# Patient Record
Sex: Female | Born: 1975
Health system: Southern US, Community
[De-identification: ages and names within clinical notes are randomized; demographics above are authoritative.]

## PROBLEM LIST (undated history)

## (undated) DIAGNOSIS — IMO0002 Reserved for concepts with insufficient information to code with codable children: Secondary | ICD-10-CM

## (undated) DIAGNOSIS — Z87440 Personal history of urinary (tract) infections: Secondary | ICD-10-CM

## (undated) DIAGNOSIS — Z87448 Personal history of other diseases of urinary system: Secondary | ICD-10-CM

## (undated) DIAGNOSIS — F419 Anxiety disorder, unspecified: Secondary | ICD-10-CM

## (undated) DIAGNOSIS — N870 Mild cervical dysplasia: Secondary | ICD-10-CM

## (undated) DIAGNOSIS — R42 Dizziness and giddiness: Secondary | ICD-10-CM

## (undated) DIAGNOSIS — Z8619 Personal history of other infectious and parasitic diseases: Secondary | ICD-10-CM

## (undated) DIAGNOSIS — M50821 Other cervical disc disorders at C4-C5 level: Secondary | ICD-10-CM

## (undated) DIAGNOSIS — R102 Pelvic and perineal pain: Secondary | ICD-10-CM

## (undated) DIAGNOSIS — Z22330 Carrier of Group B streptococcus: Secondary | ICD-10-CM

## (undated) DIAGNOSIS — H7200 Central perforation of tympanic membrane, unspecified ear: Secondary | ICD-10-CM

## (undated) HISTORY — DX: Personal history of urinary (tract) infections: Z87.440

## (undated) HISTORY — DX: Personal history of other infectious and parasitic diseases: Z86.19

## (undated) HISTORY — PX: CARPAL TUNNEL RELEASE: SHX101

## (undated) HISTORY — DX: Anxiety disorder, unspecified: F41.9

## (undated) HISTORY — DX: Carrier of group B Streptococcus: Z22.330

## (undated) HISTORY — DX: Pelvic and perineal pain: R10.2

## (undated) HISTORY — DX: Reserved for concepts with insufficient information to code with codable children: IMO0002

## (undated) HISTORY — PX: INTRAUTERINE DEVICE INSERTION: SHX323

## (undated) HISTORY — DX: Personal history of other diseases of urinary system: Z87.448

## (undated) HISTORY — DX: Mild cervical dysplasia: N87.0

## (undated) HISTORY — PX: INNER EAR SURGERY: SHX679

---

## 1996-09-16 HISTORY — PX: WISDOM TOOTH EXTRACTION: SHX21

## 1998-07-17 ENCOUNTER — Inpatient Hospital Stay (HOSPITAL_COMMUNITY): Admission: AD | Admit: 1998-07-17 | Discharge: 1998-07-17 | Payer: Self-pay | Admitting: Obstetrics and Gynecology

## 1998-08-29 ENCOUNTER — Other Ambulatory Visit: Admission: RE | Admit: 1998-08-29 | Discharge: 1998-08-29 | Payer: Self-pay | Admitting: Obstetrics and Gynecology

## 1998-09-26 ENCOUNTER — Other Ambulatory Visit: Admission: RE | Admit: 1998-09-26 | Discharge: 1998-09-26 | Payer: Self-pay | Admitting: Obstetrics & Gynecology

## 1999-01-22 ENCOUNTER — Other Ambulatory Visit: Admission: RE | Admit: 1999-01-22 | Discharge: 1999-01-22 | Payer: Self-pay | Admitting: Obstetrics and Gynecology

## 1999-03-15 ENCOUNTER — Inpatient Hospital Stay (HOSPITAL_COMMUNITY): Admission: AD | Admit: 1999-03-15 | Discharge: 1999-03-16 | Payer: Self-pay | Admitting: *Deleted

## 1999-03-15 ENCOUNTER — Inpatient Hospital Stay (HOSPITAL_COMMUNITY): Admission: AD | Admit: 1999-03-15 | Discharge: 1999-03-15 | Payer: Self-pay | Admitting: Obstetrics and Gynecology

## 1999-05-06 ENCOUNTER — Other Ambulatory Visit: Admission: RE | Admit: 1999-05-06 | Discharge: 1999-05-06 | Payer: Self-pay | Admitting: Obstetrics & Gynecology

## 1999-06-20 ENCOUNTER — Other Ambulatory Visit: Admission: RE | Admit: 1999-06-20 | Discharge: 1999-06-20 | Payer: Self-pay | Admitting: Obstetrics and Gynecology

## 1999-06-20 ENCOUNTER — Encounter (INDEPENDENT_AMBULATORY_CARE_PROVIDER_SITE_OTHER): Payer: Self-pay | Admitting: Specialist

## 1999-06-20 DIAGNOSIS — IMO0002 Reserved for concepts with insufficient information to code with codable children: Secondary | ICD-10-CM

## 1999-06-20 DIAGNOSIS — R87619 Unspecified abnormal cytological findings in specimens from cervix uteri: Secondary | ICD-10-CM

## 1999-06-20 HISTORY — DX: Unspecified abnormal cytological findings in specimens from cervix uteri: R87.619

## 1999-06-20 HISTORY — DX: Reserved for concepts with insufficient information to code with codable children: IMO0002

## 1999-10-07 ENCOUNTER — Other Ambulatory Visit: Admission: RE | Admit: 1999-10-07 | Discharge: 1999-10-07 | Payer: Self-pay | Admitting: Obstetrics and Gynecology

## 2000-01-10 ENCOUNTER — Ambulatory Visit (HOSPITAL_BASED_OUTPATIENT_CLINIC_OR_DEPARTMENT_OTHER): Admission: RE | Admit: 2000-01-10 | Discharge: 2000-01-10 | Payer: Self-pay | Admitting: *Deleted

## 2000-03-11 ENCOUNTER — Other Ambulatory Visit: Admission: RE | Admit: 2000-03-11 | Discharge: 2000-03-11 | Payer: Self-pay | Admitting: Obstetrics and Gynecology

## 2002-05-09 ENCOUNTER — Other Ambulatory Visit: Admission: RE | Admit: 2002-05-09 | Discharge: 2002-05-09 | Payer: Self-pay | Admitting: Obstetrics and Gynecology

## 2003-05-15 ENCOUNTER — Other Ambulatory Visit: Admission: RE | Admit: 2003-05-15 | Discharge: 2003-05-15 | Payer: Self-pay | Admitting: Obstetrics and Gynecology

## 2003-05-20 DIAGNOSIS — N870 Mild cervical dysplasia: Secondary | ICD-10-CM

## 2003-05-20 HISTORY — DX: Mild cervical dysplasia: N87.0

## 2003-05-20 HISTORY — PX: COLPOSCOPY: SHX161

## 2003-10-06 ENCOUNTER — Other Ambulatory Visit: Admission: RE | Admit: 2003-10-06 | Discharge: 2003-10-06 | Payer: Self-pay | Admitting: Obstetrics and Gynecology

## 2003-10-11 DIAGNOSIS — IMO0002 Reserved for concepts with insufficient information to code with codable children: Secondary | ICD-10-CM

## 2003-10-11 HISTORY — DX: Reserved for concepts with insufficient information to code with codable children: IMO0002

## 2004-01-17 ENCOUNTER — Other Ambulatory Visit: Admission: RE | Admit: 2004-01-17 | Discharge: 2004-01-17 | Payer: Self-pay | Admitting: Obstetrics and Gynecology

## 2004-03-29 ENCOUNTER — Other Ambulatory Visit: Admission: RE | Admit: 2004-03-29 | Discharge: 2004-03-29 | Payer: Self-pay | Admitting: Obstetrics and Gynecology

## 2004-05-19 DIAGNOSIS — R102 Pelvic and perineal pain: Secondary | ICD-10-CM

## 2004-05-19 HISTORY — DX: Pelvic and perineal pain: R10.2

## 2004-06-11 ENCOUNTER — Other Ambulatory Visit: Admission: RE | Admit: 2004-06-11 | Discharge: 2004-06-11 | Payer: Self-pay | Admitting: Obstetrics and Gynecology

## 2004-10-08 ENCOUNTER — Other Ambulatory Visit: Admission: RE | Admit: 2004-10-08 | Discharge: 2004-10-08 | Payer: Self-pay | Admitting: Obstetrics and Gynecology

## 2005-02-04 ENCOUNTER — Other Ambulatory Visit: Admission: RE | Admit: 2005-02-04 | Discharge: 2005-02-04 | Payer: Self-pay | Admitting: Obstetrics and Gynecology

## 2005-03-13 ENCOUNTER — Ambulatory Visit (HOSPITAL_COMMUNITY): Admission: RE | Admit: 2005-03-13 | Discharge: 2005-03-13 | Payer: Self-pay | Admitting: Obstetrics and Gynecology

## 2005-07-06 ENCOUNTER — Inpatient Hospital Stay (HOSPITAL_COMMUNITY): Admission: AD | Admit: 2005-07-06 | Discharge: 2005-07-06 | Payer: Self-pay | Admitting: Obstetrics and Gynecology

## 2005-07-19 ENCOUNTER — Inpatient Hospital Stay (HOSPITAL_COMMUNITY): Admission: AD | Admit: 2005-07-19 | Discharge: 2005-07-21 | Payer: Self-pay | Admitting: Obstetrics and Gynecology

## 2008-05-19 HISTORY — PX: CARPAL TUNNEL RELEASE: SHX101

## 2009-03-22 ENCOUNTER — Ambulatory Visit (HOSPITAL_BASED_OUTPATIENT_CLINIC_OR_DEPARTMENT_OTHER): Admission: RE | Admit: 2009-03-22 | Discharge: 2009-03-22 | Payer: Self-pay | Admitting: Orthopedic Surgery

## 2010-06-09 ENCOUNTER — Encounter: Payer: Self-pay | Admitting: Obstetrics and Gynecology

## 2010-08-21 LAB — POCT HEMOGLOBIN-HEMACUE: Hemoglobin: 13.7 g/dL (ref 12.0–15.0)

## 2010-08-21 LAB — POCT PREGNANCY, URINE: Preg Test, Ur: NEGATIVE

## 2010-10-04 NOTE — Op Note (Signed)
[place]  Patient:    Jasmine Cortez, Jasmine Cortez                         MRN: [id number] Proc. Date: 01/10/00 Adm. Date:  [id number] Disc. Date: [id number] Attending:  Aundria Mems                           Operative Report  PREOPERATIVE  DIAGNOSIS: Central perforation with conductive hearing loss AD.  OPERATIVE PROCEDURE: Type 1 by pedicle fascial graft tympanoplasty AD.  POSTOPERATIVE DIAGNOSIS: Central perforation with conductive hearing loss AD.  SURGEON:  Kathy Breach, M.D.  DESCRIPTION OF PROCEDURE:  The patient under general orotracheal anesthesia the right ear was prepped and draped in a sterile fashion.  External canal was infiltrated with 1% xylocaine 1:100,000 epinephrine for vasoconstriction.  The skin immediately posterior superior to the auricle above the hairline was also infiltrated for donor site preparation.  The patients ear was thoroughly cleaned and inspected and revealed that she had large almost entirely posterior tympanic membrane perforation with free standing margins.  The margins were freshened circumferentially with cup forceps removing the rim of the perforation entirely.  A tympanotomy incision was made from 12 oclock superiorly and then 6 oclock inferiorly raising the flap and turning forward, elevating into the middle ear space.  The ossicular chain was visually intact and with manipulation along the process normal mobility of the remaining ossicular chain was noted.  At this point approximately a one inch slightly curved incision just below the hairline posterior superior to the auricle was made to the skin and subcutaneous tissue down to the temporalis fascia.  The temporalis fascia graft was harvested and pressed between tongue blades.  The donor site was repaired with interrupted chromic catgut sutures subcutaneously and a running 4-0 subcuticular skin approximation.  Attention returned to the ear and the middle ear  space was packed with xeroform pledgets soaked with Cortisporin suspension to the level of the under surface of the tympanic membrane and canaliculus.  The pressed temporalis fascial graft was cut to size and inserted laid it on the bed of Gelfoam packing it circumferentially beneath the anterior margin of perforation and draping it up the posterior bony canal wall.  Tympanotomy flap was turned back into position noting the fascial graft completely expanding the perforation site.  This was stabilized into position packing the external canal with xeroform pledgets soaked with Cortisporin suspension.  Sterile cotton was placed in the external meatus.  Cortisporin ointment was applied to the graft donor site incision. The patient tolerated the procedure well and was taken to the recovery room in stable general condition. DD:  01/10/00 TD:  01/12/00 Job: [id number]

## 2010-10-04 NOTE — H&P (Signed)
Jasmine Cortez, Jasmine Cortez              ACCOUNT NO.:  [id number]   MEDICAL RECORD NO.:  [id number]          PATIENT TYPE:  INP   LOCATION:  9114                          FACILITY:  [facility]   PHYSICIAN:  Naima A. Dillard, M.D. DATE OF BIRTH:  June 14, 1975   DATE OF ADMISSION:  07/19/2005  DATE OF DISCHARGE:                                HISTORY & PHYSICAL   Jasmine Cortez is a 35 year old gravida 3, para 2-0-0-2, who presents at 53  and 2/7 weeks' gestation with complaints of regular uterine contractions  since the evening prior to admission.  The patient's last menstrual period  Oct 05, 2004.  However, best obstetrical estimate established by a 10-week  ultrasound with an [facility] of July 17, 2005.  The patient denies leakage of  fluid or bleeding.  The patient reports that her fetus has been moving  normally.  The patient reports that contractions have increased in intensity  and frequency since their onset earlier in the evening.  The patient's  pregnancy is remarkable for:  1.  Abnormal irregular LMP.  2.  History of abnormal Paps with normal Pap followup.  3.  History of a large for gestational age infant in the past.  4.  History of pyelonephritis.  5.  Positive group B strep.  6.  Increased Down syndrome risk on quad screen with no amniocentesis.   The patient has been normotensive throughout her pregnancy with no signs and  symptoms of preeclampsia.   PRENATAL LABORATORY:  Blood type is A positive.  Antibody screen negative.  Hemoglobin 11.6 and platelets 239,000.  Rubella immune, hepatitis B surface  antigen negative, syphilis nonreactive, HIV nonreactive, gonorrhea and  Chlamydia negative.  Pap within normal limits.  Cystic fibrosis screen  negative.  A 1-hour Glucola at 18 weeks 81.  Quad screen at 18 weeks  increased Down syndrome risk of 1/128.  A 28-week Glucola 98 and 28-week  hemoglobin 10.9.  Positive group B strep on June 16, 2005.  Third  trimester gonorrhea and Chlamydia  declined.   HISTORY OF PRESENT PREGNANCY:  The patient entered care at 10 weeks'  gestation.  The patient's pregnancy has been followed by the CNM Service at  [facility].  The patient underwent ultrasound at the time of  her new OB visit, which established her EDC secondary to irregular menses  with breakthrough bleeding.  The patient declined first trimester screening.  The patient with an abnormal Pap and mild dysplasia November 2005.  The  patient treated for yeast at 12 weeks' gestation.  Ultrasound done at 19  weeks' gestation revealed a single intrauterine pregnancy, which confirmed  an EDC of July 17, 2005.  Anterior placenta and limited anatomy of the fetal  intracranial structures and face.  Quad screen drawn at 19 weeks, as well as  a 1-hour Glucola was done at 19 weeks secondary to a history of a previous  LGA infant.  Glucose at 19 weeks found to be 81.  Quad screen returned with  an increased Down syndrome risk of 1/128.  The patient was evaluated by Dr.  Su Hilt  and was offered amniocentesis.  However, she declined amnio.  Ultrasound at [facility] [place] revealed an ultrasound  consistent with dates.  Ultrasound at [facility] [place] in  light of patient's previous increased Down syndrome risk with adjusted risk  of 1/725.  A quad screen was also recalculated, which adjusted the patient's  risk to 1/261.  The patient was started on iron supplementation at 26 weeks'  gestation secondary to hemoglobin of 10.9.  Repeat Glucola at 25 weeks' 6  days' gestation with a value of 98.  The patient with complaints of restless  leg syndrome, which she treated with Benadryl.  The patient with complaint  of ear pain at 32 weeks' gestation with findings of otitis media treated  with Augmentin.  Ultrasound repeated at 33 weeks secondary to a previous LGA  infant and previous increased Down syndrome risk.  Ultrasound at that time  revealed estimated fetal  weight at the 60th percentile with normal amniotic  fluid and anterior placenta.  Group B strep obtained at 35 weeks and repeat  cultures were negative.  The remainder patient's pregnancy has been  unremarkable with the exception of some insomnia, which she is treated with  sporadic use of Ambien.   OB HISTORY:  Pregnancy #1:  November 1995, spontaneous vaginal delivery,  viable female infant, 9 pounds at 41 weeks and 24-hour labor.  Pregnancy #2:  October 2000, spontaneous vaginal delivery, viable female infant, 7 pounds  13 ounces at 40 weeks' and 6-hour labor.  Pregnancy #3:  Present.   GYNECOLOGIC HISTORY:  The patient denies a history of LGSIL in the past  status post colpo in February 2005.  Followup Paps have been within normal  limits.  The patient denies history of STDs.  The patient with some slightly  irregular menses over the past year with intramenstrual bleeding noted.  No  other GYN history.   MEDICAL HISTORY:  History of pyelonephritis x1.   SURGICAL HISTORY:  In 2001, tympanoplasty.   FAMILY HISTORY:  Father with hypothyroidism and hepatitis C.  Maternal  grandmother, coronary artery disease.  Mother, hepatitis C and cervical  cancer.  The patient's second child with sickle cell trait.  Otherwise,  negative.   SOCIAL HISTORY:  The patient denies use of tobacco, alcohol or street drugs.  The patient's domestic violence screen is negative.  The patient is married  to the father of the baby, Caryn Bee, who is involved and supportive.  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  Caryn Bee is a Scientist, research (medical) for the state of [employer] with  16 years of education.  The patient and her husband do not subscribe to a  particular religious faith.   CURRENT MEDICATIONS:  Prenatal vitamins.   ALLERGIES:  NO KNOWN DRUG ALLERGIES.   PHYSICAL EXAMINATION:  VITAL SIGNS:  The patient is afebrile.  Vital signs  are  stable. GENERAL:  The patient is in no apparent distress.  SKIN:  Warm and dry.  Color is satisfactory.  HEENT:  Within normal limits.  HEART:  Regular rate and rhythm.  LUNGS:  Clear.  ABDOMEN:  Soft, gravid and nontender.  Fetus noted to be vertex to Leopold's  and in longitudinal lie.  Fundal height consistent with 40 weeks' gestation.  Fetal heart rate 140s with variability noted to be present, and  accelerations present.  No decelerations are noted.  Uterine contractions  are noted  to be every 4-6 minutes, 40-60 seconds duration with moderate  intensity.  CERVICAL EXAM:  Cervix to be 3 cm dilated, 70% effaced and -2 station on  admission.  However, after observation for 1 hour, the cervix changed to 4  cm, 80% effaced and -2 station.  EXTREMITIES:  Negative edema and negative Homans' sign bilaterally.  Deep  tendon reflexes are 2+ and no clonus.   ASSESSMENT:  1.  Intrauterine pregnancy at 78 and 2/7 weeks' gestation.  2.  Early labor.  3.  Positive group B streptococcus.   PLAN:  1.  The patient admitted to birthing suites, per consult with Dr. Normand Sloop.  2.  The patient will be started on penicillin G for group B strep      prophylaxis.  3.  The patient plans epidural for analgesia during labor.      Jasmine Cortez, CNM      Naima A. Normand Sloop, M.D.  Electronically Signed    NOS/MEDQ  D:  07/19/2005  T:  07/19/2005  Job:  [id number]

## 2011-07-04 ENCOUNTER — Ambulatory Visit: Payer: Self-pay | Admitting: Cardiology

## 2011-07-18 ENCOUNTER — Encounter: Payer: Self-pay | Admitting: Cardiology

## 2011-07-18 ENCOUNTER — Ambulatory Visit (INDEPENDENT_AMBULATORY_CARE_PROVIDER_SITE_OTHER): Payer: PRIVATE HEALTH INSURANCE | Admitting: Cardiology

## 2011-07-18 VITALS — BP 110/70 | HR 57 | Ht 66.0 in | Wt 145.0 lb

## 2011-07-18 DIAGNOSIS — R002 Palpitations: Secondary | ICD-10-CM

## 2011-07-18 NOTE — Assessment & Plan Note (Signed)
The patient has a significant family history. Given this screening exercise treadmill test would be reasonable.  I will bring the patient back for a POET (Plain Old Exercise Test). This will allow me to screen for obstructive coronary disease, risk stratify and very importantly provide a prescription for exercise.

## 2011-07-18 NOTE — Progress Notes (Signed)
HPI The patient presents for evaluation of cardiovascular risk factors. She has had no past cardiac history.  However, her mother died last year suddenly of a myocardial infarction. She has had no prior cardiac testing. She is active and exercises routinely. With this she denies any chest pressure, neck or arm discomfort. He denies any palpitations, presyncope or syncope other than episodes at night. He has been waking occasionally perhaps a couple times weekly with a rapid heart rate. She will anxious might have some chest discomfort. She'll take a Xanax and is improved. It is thought that this has been having. She can bring these episodes on and she doesn't have them with activity. She otherwise says her breathing is okay. He denies any PND or orthopnea. She's had no weight gain or edema.  Of note she thinks her lipid profile has been within normal limits.  No Known Allergies  Current Outpatient Prescriptions  Medication Sig Dispense Refill  . ALPRAZolam (XANAX) 0.5 MG tablet Take 0.5 mg by mouth 2 (two) times daily as needed. 1/2 - 1 tablet twice a day as needed      . cholecalciferol (VITAMIN D) 1000 UNITS tablet Take 1,000 Units by mouth daily.      . fish oil-omega-3 fatty acids 1000 MG capsule Take 2 g by mouth daily.      . Multiple Vitamin (MULTI-VITAMIN DAILY PO) Take by mouth.        Past Medical History  Diagnosis Date  . Migraine   . Anxiety     Past Surgical History  Procedure Date  . Intrauterine device insertion   . Carpal tunnel release     right    Family History  Problem Relation Age of Onset  . Heart attack Mother 88    Fatal MI  . Hypertension Father   . Thyroid disease Father   . Heart attack Maternal Grandmother 30  . Hypertension Maternal Grandmother     History   Social History  . Marital Status: Married    Spouse Name: N/A    Number of Children: 3  . Years of Education: N/A   Occupational History  .     Social History Main Topics  .  Smoking status: Former Smoker -- 0.5 packs/day for 5 years    Types: Cigarettes    Quit date: 07/13/2010  . Smokeless tobacco: Not on file  . Alcohol Use: Yes  . Drug Use: Not on file  . Sexually Active: Not on file   Other Topics Concern  . Not on file   Social History Narrative   Lives at home with husband and 3 children.  Exercises routinely.    ROS:  Seasonal rhinitis.  Otherwise as stated in the HPI and negative for all other systems.  PHYSICAL EXAM BP 110/70  Pulse 57  Ht 5\' 6"  (1.676 m)  Wt 145 lb (65.772 kg)  BMI 23.40 kg/m2 GENERAL:  Well appearing HEENT:  Pupils equal round and reactive, fundi not visualized, oral mucosa unremarkable NECK:  No jugular venous distention, waveform within normal limits, carotid upstroke brisk and symmetric, no bruits, no thyromegaly LYMPHATICS:  No cervical, inguinal adenopathy LUNGS:  Clear to auscultation bilaterally BACK:  No CVA tenderness CHEST:  Unremarkable HEART:  PMI not displaced or sustained,S1 and S2 within normal limits, no S3, no S4, no clicks, no rubs, no murmurs ABD:  Flat, positive bowel sounds normal in frequency in pitch, no bruits, no rebound, no guarding, no midline pulsatile mass,  no hepatomegaly, no splenomegaly EXT:  2 plus pulses throughout, no edema, no cyanosis no clubbing SKIN:  No rashes no nodules NEURO:  Cranial nerves II through XII grossly intact, motor grossly intact throughout PSYCH:  Cognitively intact, oriented to person place and time  EKG:  Sinus rhythm, rate 57, axis within normal limits, intervals within normal limits, no acute ST-T wave changes.   ASSESSMENT AND PLAN

## 2011-07-18 NOTE — Patient Instructions (Signed)
Your physician has requested that you have an exercise tolerance test. For further information please visit [email or web address]. Please also follow instruction sheet, as given.  The current medical regimen is effective;  continue present plan and medications.  

## 2011-08-29 ENCOUNTER — Ambulatory Visit (INDEPENDENT_AMBULATORY_CARE_PROVIDER_SITE_OTHER): Payer: PRIVATE HEALTH INSURANCE | Admitting: Physician Assistant

## 2011-08-29 DIAGNOSIS — R002 Palpitations: Secondary | ICD-10-CM

## 2011-08-29 NOTE — Progress Notes (Signed)
Exercise Treadmill Test  Pre-Exercise Testing Evaluation Rhythm: normal sinus  Rate: 65   PR:  .16 QRS:  .08  QT:  .38 QTc: .40     Test  Exercise Tolerance Test Ordering MD: Angelina Sheriff, MD  Interpreting MD:  Lorin Picket weaver PA-C  Unique Test No: 1   Treadmill:  1  Indication for ETT: Palpitations  Contraindication to ETT: No   Stress Modality: exercise - treadmill  Cardiac Imaging Performed: non   Protocol: standard Bruce - maximal  Max BP:  157/81  Max MPHR (bpm):  186 85% MPR (bpm):  158  MPHR obtained (bpm):  176 % MPHR obtained:  92%  Reached 85% MPHR (min:sec):  9:20 Total Exercise Time (min-sec):  10:35  Workload in METS:  12.6 Borg Scale: 17  Reason ETT Terminated:  patient's desire to stop    ST Segment Analysis At Rest: normal ST segments - no evidence of significant ST depression With Exercise: no evidence of significant ST depression  Other Information Arrhythmia:  No Angina during ETT:  absent (0) Quality of ETT:  diagnostic  ETT Interpretation:  normal - no evidence of ischemia by ST analysis  Comments: Excellent exercise tolerance. No chest pain. Normal BP response to exercise. No ST-T changes to suggest ischemia.   Recommendations: Follow up with Dr. Rollene Rotunda as directed. Tereso Newcomer, PA-C  9:48 AM 08/29/2011

## 2011-10-30 DIAGNOSIS — N870 Mild cervical dysplasia: Secondary | ICD-10-CM | POA: Insufficient documentation

## 2011-11-14 ENCOUNTER — Ambulatory Visit (INDEPENDENT_AMBULATORY_CARE_PROVIDER_SITE_OTHER): Payer: PRIVATE HEALTH INSURANCE | Admitting: Obstetrics and Gynecology

## 2011-11-14 ENCOUNTER — Encounter: Payer: Self-pay | Admitting: Obstetrics and Gynecology

## 2011-11-14 VITALS — BP 122/62 | Ht 66.0 in | Wt 153.0 lb

## 2011-11-14 DIAGNOSIS — Z01419 Encounter for gynecological examination (general) (routine) without abnormal findings: Secondary | ICD-10-CM

## 2011-11-14 DIAGNOSIS — Z124 Encounter for screening for malignant neoplasm of cervix: Secondary | ICD-10-CM

## 2011-11-14 NOTE — Progress Notes (Signed)
The patient reports:no complaints  Contraception:IUD  Last mammogram: not applicable Last pap: approximate date 10/17/2010 and was normal   GC/Chlamydia cultures offered: declined HIV/RPR/HbsAg offered:  declined HSV 1 and 2 glycoprotein offered: declined  Menstrual cycle regular and monthly: No: IUD Menstrual flow normal: No: IUD  Urinary symptoms: none Normal bowel movements: Yes Reports abuse at home: No  Subjective:    Jasmine Cortez is a 36 y.o. female, [id number], who presents for an annual exam. Mirena x 2010    History   Social History  . Marital Status: Married    Spouse Name: N/A    Number of Children: 3  . Years of Education: N/A   Occupational History  .     Social History Main Topics  . Smoking status: Never Smoker   . Smokeless tobacco: Never Used  . Alcohol Use: No  . Drug Use: No  . Sexually Active: Yes    Birth Control/ Protection: IUD     MIRENA   Other Topics Concern  . None   Social History Narrative   Lives at home with husband and 3 children.  Exercises routinely.    Menstrual cycle:   LMP: No LMP recorded. Patient is not currently having periods (Reason: IUD).           Cycle:amenorrhea  The following portions of the patient's history were reviewed and updated as appropriate: allergies, current medications, past family history, past medical history, past social history, past surgical history and problem list.  Review of Systems Pertinent items are noted in HPI. Breast:Negative for breast lump,nipple discharge or nipple retraction Gastrointestinal: Negative for abdominal pain, change in bowel habits or rectal bleeding Urinary:negative   Objective:    BP 122/62  Ht 5\' 6"  (1.676 m)  Wt 153 lb (69.4 kg)  BMI 24.69 kg/m2    Weight:  Wt Readings from Last 1 Encounters:  11/14/11 153 lb (69.4 kg)          BMI: Body mass index is 24.69 kg/(m^2).  General Appearance: Alert, appropriate appearance for age. No acute distress HEENT:  Grossly normal Neck / Thyroid: Supple, no masses, nodes or enlargement Lungs: clear to auscultation bilaterally Back: No CVA tenderness Breast Exam: No dimpling, nipple retraction or discharge. No masses or nodes. and No masses or nodes.No dimpling, nipple retraction or discharge. Cardiovascular: Regular rate and rhythm. S1, S2, no murmur Gastrointestinal: Soft, non-tender, no masses or organomegaly Pelvic Exam: Vulva and vagina appear normal. Bimanual exam reveals normal uterus and adnexa.RV + strings Rectovaginal: not indicated Lymphatic Exam: Non-palpable nodes in neck, clavicular, axillary, or inguinal regions Skin: no rash or abnormalities Neurologic: Normal gait and speech, no tremor  Psychiatric: Alert and oriented, appropriate affect.    Assessment:    Normal gyn exam    Plan:   pap smear return annually or prn STD screening: declined Contraception:IUD      Mujahid Jalomo AMD

## 2011-11-17 LAB — PAP IG W/ RFLX HPV ASCU

## 2014-03-20 ENCOUNTER — Encounter: Payer: Self-pay | Admitting: Obstetrics and Gynecology

## 2015-12-14 ENCOUNTER — Ambulatory Visit
Admission: RE | Admit: 2015-12-14 | Discharge: 2015-12-14 | Disposition: A | Discharge status: Home / Self Care | Payer: 59 | Source: Ambulatory Visit | Attending: Family Medicine | Admitting: Family Medicine

## 2015-12-14 ENCOUNTER — Other Ambulatory Visit: Payer: Self-pay | Admitting: Family Medicine

## 2015-12-14 DIAGNOSIS — M542 Cervicalgia: Secondary | ICD-10-CM

## 2016-02-08 ENCOUNTER — Encounter: Payer: Self-pay | Admitting: Cardiovascular Disease

## 2016-02-08 ENCOUNTER — Ambulatory Visit (INDEPENDENT_AMBULATORY_CARE_PROVIDER_SITE_OTHER): Payer: 59 | Admitting: Cardiovascular Disease

## 2016-02-08 VITALS — BP 102/74 | HR 64 | Resp 22 | Ht 66.0 in | Wt 158.8 lb

## 2016-02-08 DIAGNOSIS — Z720 Tobacco use: Secondary | ICD-10-CM

## 2016-02-08 DIAGNOSIS — R002 Palpitations: Secondary | ICD-10-CM

## 2016-02-08 DIAGNOSIS — F17200 Nicotine dependence, unspecified, uncomplicated: Secondary | ICD-10-CM | POA: Insufficient documentation

## 2016-02-08 DIAGNOSIS — Z8249 Family history of ischemic heart disease and other diseases of the circulatory system: Secondary | ICD-10-CM | POA: Diagnosis not present

## 2016-02-08 NOTE — Patient Instructions (Addendum)
Medication Instructions:   No medication changes made  Labwork:  No new labs needed  Testing/Procedures:  Research CT coronary calcium score  There is as one-time fee of $150 due at the time of your procedure Please call [phone] when you are ready to schedule [address], [place] [address]  Follow-Up: It was a pleasure seeing you in the office today. Please call us if you have new issues that need to be addressed before your next appt.  [phone]  Your physician wants you to follow-up in: prn  If you need a refill on your cardiac medications before your next appointment, please call your pharmacy.    Coronary Calcium Scan A coronary calcium scan is an imaging test used to look for deposits of calcium and other fatty materials (plaques) in the inner lining of the blood vessels of your heart (coronary arteries). These deposits of calcium and plaques can partly clog and narrow the coronary arteries without producing any symptoms or warning signs. This puts you at risk for a heart attack. This test can detect these deposits before symptoms develop.  LET [facility] PROVIDER KNOW ABOUT:  Any allergies you have.  All medicines you are taking, including vitamins, herbs, eye drops, creams, and over-the-counter medicines.  Previous problems you or members of your family have had with the use of anesthetics.  Any blood disorders you have.  Previous surgeries you have had.  Medical conditions you have.  Possibility of pregnancy, if this applies. RISKS AND COMPLICATIONS Generally, this is a safe procedure. However, as with any procedure, complications can occur. This test involves the use of radiation. Radiation exposure can be dangerous to a pregnant woman and her unborn baby. If you are pregnant, you should not have this procedure done.  BEFORE THE PROCEDURE There is no special preparation for the procedure. PROCEDURE  You will need to undress and put  on a hospital gown. You will need to remove any jewelry around your neck or chest.  Sticky electrodes are placed on your chest and are connected to an electrocardiogram (EKG or electrocardiography) machine to recorda tracing of the electrical activity of your heart.  A CT scanner will take pictures of your heart. During this time, you will be asked to lie still and hold your breath for 2-3 seconds while a picture is being taken of your heart. AFTER THE PROCEDURE   You will be allowed to get dressed.  You can return to your normal activities after the scan is done.   This information is not intended to replace advice given to you by your health care provider. Make sure you discuss any questions you have with your health care provider.   Document Released: 11/01/2007 Document Revised: 05/10/2013 Document Reviewed: 01/10/2013 Elsevier Interactive Patient Education [employer]2016 [employer]vier Inc.

## 2016-02-08 NOTE — Addendum Note (Signed)
Addended by: Rhea BeltonMOODY, Arcola Freshour R on: 02/08/2016 03:43 PM   Modules accepted: Orders

## 2016-02-08 NOTE — Progress Notes (Signed)
Cardiology Office Note  Date:  02/08/2016   ID:  Jasmine Cortez, DOB 1975-09-04, MRN [id number]  PCP:  Leanor Rubenstein, MD   Chief Complaint  Patient presents with  . Other    Mother and grandmother passed 69-50's. Cousin 38 had heart attack. Pt has chest tightness. Pt unsure if it is panic attacks or something to be concerned about. She states that she has a "hard thump" at times.    HPI:  Jasmine Cortez is a 40 year old woman with history of social smoking, strong family history of coronary artery disease, anxiety who presents by self-referral for evaluation of palpitations And for risk stratification of her family history.  Active at baseline, denies any significant shortness of breath or chest pain She is very concerned given mother died at an early age 63 years ago, Mother died in front of her, needed CPR She has had PTSD from this experience Also reports that she does smoke as her mother did  Also reports having a cousin who is 25 years old who recently had stent placed. Other relatives also with underlying coronary disease  She reports having symptoms of palpitations described as a hard thump that will come and go, sometimes once a day, sometimes sparingly, sometimes twice a day, symptoms do not last very long, for a brief, possibly followed by a pause. Sometimes makes her feel dizzy for a split second    more than 10 pound weight gain over the past several years  post recent lipid panel not available. She was told in the past that it was not that high  EKG on today's visit shows normal sinus rhythm with rate 64 bpm, sinus arrhythmia otherwise normal EKG  PMH:   has a past medical history of Abnormal Pap smear (06/1999); Anxiety; CIN I (cervical intraepithelial neoplasia I) (2005); GBS carrier; H/O pyelonephritis; H/O varicella; UTI (urinary tract infection); LGSIL (low grade squamous intraepithelial dysplasia) (10/11/03); Migraine; and Pelvic pain (2006).  PSH:    Past Surgical  History:  Procedure Laterality Date  . CARPAL TUNNEL RELEASE     right  . CARPAL TUNNEL RELEASE Right 2010  . COLPOSCOPY  2005  . INTRAUTERINE DEVICE INSERTION    . WISDOM TOOTH EXTRACTION  09/1996    Current Outpatient Prescriptions  Medication Sig Dispense Refill  . ALPRAZolam (XANAX) 0.5 MG tablet Take 0.5 mg by mouth 2 (two) times daily as needed. 1/2 - 1 tablet twice a day as needed     No current facility-administered medications for this visit.      Allergies:   Review of patient's allergies indicates no known allergies.   Social History:  The patient  reports that she has been smoking Cigarettes.  She has a 1.25 pack-year smoking history. She has never used smokeless tobacco. She reports that she does not drink alcohol or use drugs.   Family History:   family history includes Cancer in her maternal aunt and mother; Heart attack (age of onset: 19) in her maternal grandmother; Heart attack (age of onset: 74) in her mother; Heart disease in her maternal grandmother and mother; Hepatitis in her father and mother; Hypertension in her father and maternal grandmother; Thyroid disease in her father.    Review of Systems: Review of Systems  Constitutional: Negative.   Respiratory: Negative.   Cardiovascular: Positive for palpitations.  Gastrointestinal: Negative.   Musculoskeletal: Negative.   Neurological: Negative.   Psychiatric/Behavioral: The patient is nervous/anxious.   All other systems reviewed and are negative.  PHYSICAL EXAM: VS:  BP 102/74 (BP Location: Left Arm, Patient Position: Sitting, Cuff Size: Normal)   Pulse 64   Resp (!) 22   Ht 5\' 6"  (1.676 m)   Wt 158 lb 12 oz (72 kg)   BMI 25.62 kg/m  , BMI Body mass index is 25.62 kg/m. GEN: Well nourished, well developed, in no acute distress  HEENT: normal  Neck: no JVD, carotid bruits, or masses Cardiac: RRR; no murmurs, rubs, or gallops,no edema  Respiratory:  clear to auscultation bilaterally, normal  work of breathing GI: soft, nontender, nondistended, + BS MS: no deformity or atrophy  Skin: warm and dry, no rash Neuro:  Strength and sensation are intact Psych: euthymic mood, full affect    Recent Labs: No results found for requested labs within last 8760 hours.    Lipid Panel No results found for: CHOL, HDL, LDLCALC, TRIG    Wt Readings from Last 3 Encounters:  02/08/16 158 lb 12 oz (72 kg)  11/14/11 153 lb (69.4 kg)  07/18/11 145 lb (65.8 kg)       ASSESSMENT AND PLAN:  Family history of premature CAD - Plan: CT CARDIAC SCORING Long discussion concerning her various risk factors  Previous Lipid panel has been requested for our records  Denies any shortness of breath or chest pain concerning for angina  We have discussed various benefits of a screening tests such as CT coronary calcium score  She is interested in the CT screening test and order has a placed, she will call to schedule  If score is very low, would likely not need aggressive cholesterol management  We have recommended smoking cessation  If score is markedly elevated, additional testing might be needed  We have discussed various types of stress testing if her score is markedly high   Palpitations  palpitations likely secondary to PVCs, unable to exclude APCs   rare events, solitary followed by a short pause She does not want medications and given low blood pressure, this would be difficult. If symptoms do get worse, we discussed possibly doing Holter monitor to count frequency of ectopy. If severe, antiarrhythmic medications could be used  Smoker Reports being a social smoker. Recommended smoking cessation given her family history  Recommend she call our office if she develops new symptoms or if she has difficulty scheduling CT coronary calcium scoring  we will call her back with the results of any testing that she performs with further recommendations at that time   Total encounter time more than 45  minutes  Greater than 50% was spent in counseling and coordination of care with the patient   Disposition:   F/U  as needed    Orders Placed This Encounter  Procedures  . CT CARDIAC SCORING     Signed, Dossie Arbourim Rhoda Waldvogel, M.D., Ph.D. 02/08/2016  [facility], [place] [phone]

## 2016-02-15 ENCOUNTER — Encounter: Payer: Self-pay | Admitting: Radiology

## 2016-02-15 ENCOUNTER — Ambulatory Visit (INDEPENDENT_AMBULATORY_CARE_PROVIDER_SITE_OTHER)
Admission: RE | Admit: 2016-02-15 | Discharge: 2016-02-15 | Disposition: A | Discharge status: Home / Self Care | Payer: Self-pay | Source: Ambulatory Visit | Attending: Cardiovascular Disease | Admitting: Cardiovascular Disease

## 2016-02-15 DIAGNOSIS — Z8249 Family history of ischemic heart disease and other diseases of the circulatory system: Secondary | ICD-10-CM

## 2016-02-18 ENCOUNTER — Telehealth: Payer: Self-pay | Admitting: Cardiovascular Disease

## 2016-02-18 NOTE — Telephone Encounter (Signed)
Received cardiac clearance request for pt to proceed w/ Rt tympanoplasty w/ [facility] 02[facility] w/ Dr. Jenne CampusMcQueen under general anesthesia. Please fax clearance to [facility] @ [phone].

## 2016-02-20 NOTE — Telephone Encounter (Signed)
No significant cardiac issues Acceptable risk, no further testing needed [place]

## 2016-02-21 NOTE — Telephone Encounter (Signed)
Routed to fax # provided. 

## 2016-02-26 NOTE — Discharge Instructions (Signed)

## 2016-02-29 ENCOUNTER — Ambulatory Visit: Payer: Commercial Managed Care - HMO | Admitting: Student in an Organized Health Care Education/Training Program

## 2016-02-29 ENCOUNTER — Encounter
Admission: RE | Disposition: A | Discharge status: Home / Self Care | Payer: Self-pay | Source: Ambulatory Visit | Attending: Unknown Physician Specialty

## 2016-02-29 ENCOUNTER — Ambulatory Visit
Admission: RE | Admit: 2016-02-29 | Discharge: 2016-02-29 | Disposition: A | Discharge status: Home / Self Care | Payer: Commercial Managed Care - HMO | Source: Ambulatory Visit | Attending: Unknown Physician Specialty | Admitting: Unknown Physician Specialty

## 2016-02-29 DIAGNOSIS — H7291 Unspecified perforation of tympanic membrane, right ear: Secondary | ICD-10-CM | POA: Insufficient documentation

## 2016-02-29 DIAGNOSIS — F17200 Nicotine dependence, unspecified, uncomplicated: Secondary | ICD-10-CM | POA: Diagnosis not present

## 2016-02-29 DIAGNOSIS — Z79899 Other long term (current) drug therapy: Secondary | ICD-10-CM | POA: Diagnosis not present

## 2016-02-29 HISTORY — DX: Other cervical disc disorders at C4-C5 level: M50.821

## 2016-02-29 HISTORY — DX: Dizziness and giddiness: R42

## 2016-02-29 HISTORY — DX: Central perforation of tympanic membrane, unspecified ear: H72.00

## 2016-02-29 HISTORY — PX: TYMPANOPLASTY: SHX33

## 2016-02-29 SURGERY — TYMPANOPLASTY
Anesthesia: General | Laterality: Right | Wound class: Clean Contaminated

## 2016-02-29 MED ORDER — GELATIN ABSORBABLE 12-7 MM EX MISC
CUTANEOUS | Status: DC | PRN
  Administered 2016-02-29: 1 via TOPICAL

## 2016-02-29 MED ORDER — FENTANYL CITRATE (PF) 100 MCG/2ML IJ SOLN
25.0000 ug | INTRAMUSCULAR | Status: DC | PRN
  Administered 2016-02-29 (×2): 25 ug via INTRAVENOUS
  Administered 2016-02-29: 50 ug via INTRAVENOUS
  Administered 2016-02-29: 25 ug via INTRAVENOUS

## 2016-02-29 MED ORDER — GLYCOPYRROLATE 0.2 MG/ML IJ SOLN
INTRAMUSCULAR | Status: DC | PRN
  Administered 2016-02-29: .2 mg via INTRAVENOUS

## 2016-02-29 MED ORDER — OXYCODONE HCL 5 MG PO TABS
5.0000 mg | ORAL_TABLET | Freq: Once | ORAL | Status: AC | PRN
  Administered 2016-02-29: 5 mg via ORAL

## 2016-02-29 MED ORDER — BACITRACIN 500 UNIT/GM EX OINT
TOPICAL_OINTMENT | CUTANEOUS | Status: DC | PRN
  Administered 2016-02-29: 1 via TOPICAL

## 2016-02-29 MED ORDER — ONDANSETRON HCL 4 MG/2ML IJ SOLN
INTRAMUSCULAR | Status: DC | PRN
  Administered 2016-02-29: 4 mg via INTRAVENOUS

## 2016-02-29 MED ORDER — OXYCODONE-ACETAMINOPHEN 5-325 MG PO TABS: 1.0000 | ORAL_TABLET | ORAL | 0 refills | Status: DC | PRN

## 2016-02-29 MED ORDER — OXYCODONE HCL 5 MG/5ML PO SOLN: 5.0000 mg | Freq: Once | ORAL | Status: AC | PRN

## 2016-02-29 MED ORDER — LACTATED RINGERS IV SOLN
INTRAVENOUS | Status: DC
  Administered 2016-02-29: 11:00:00 via INTRAVENOUS

## 2016-02-29 MED ORDER — LIDOCAINE HCL (CARDIAC) 20 MG/ML IV SOLN
INTRAVENOUS | Status: DC | PRN
  Administered 2016-02-29: 50 mg via INTRATRACHEAL

## 2016-02-29 MED ORDER — EPINEPHRINE HCL (NASAL) 0.1 % NA SOLN
NASAL | Status: DC | PRN
  Administered 2016-02-29: 1 mL via TOPICAL

## 2016-02-29 MED ORDER — MIDAZOLAM HCL 5 MG/5ML IJ SOLN
INTRAMUSCULAR | Status: DC | PRN
  Administered 2016-02-29: 2 mg via INTRAVENOUS

## 2016-02-29 MED ORDER — DEXAMETHASONE SODIUM PHOSPHATE 4 MG/ML IJ SOLN
INTRAMUSCULAR | Status: DC | PRN
  Administered 2016-02-29: 10 mg via INTRAVENOUS

## 2016-02-29 MED ORDER — PROPOFOL 10 MG/ML IV BOLUS
INTRAVENOUS | Status: DC | PRN
  Administered 2016-02-29: 50 mg via INTRAVENOUS
  Administered 2016-02-29: 150 mg via INTRAVENOUS

## 2016-02-29 MED ORDER — FENTANYL CITRATE (PF) 100 MCG/2ML IJ SOLN
INTRAMUSCULAR | Status: DC | PRN
  Administered 2016-02-29: 100 ug via INTRAVENOUS
  Administered 2016-02-29: 50 ug via INTRAVENOUS

## 2016-02-29 MED ORDER — ONDANSETRON HCL 4 MG/2ML IJ SOLN: 4.0000 mg | Freq: Once | INTRAMUSCULAR | Status: DC | PRN

## 2016-02-29 MED ORDER — LIDOCAINE-EPINEPHRINE 1 %-1:100000 IJ SOLN
INTRAMUSCULAR | Status: DC | PRN
  Administered 2016-02-29: 4 mL

## 2016-02-29 SURGICAL SUPPLY — 31 items
ADHESIVE MASTISOL STRL (MISCELLANEOUS) ×2 IMPLANT
BLADE EAR TYMPAN 2.5 60D BEAV (BLADE) ×2 IMPLANT
BLADE MYR LANCE NRW W/HDL (BLADE) IMPLANT
CANISTER SUCT 1200ML W/VALVE (MISCELLANEOUS) ×2 IMPLANT
COTTONBALL LRG STERILE PKG (GAUZE/BANDAGES/DRESSINGS) ×2 IMPLANT
DRAPE HEAD BAR (DRAPES) ×2 IMPLANT
DRAPE MICROSCOPE ZEISS INVISI (DRAPES) ×2 IMPLANT
DRAPE SURG 17X11 SM STRL (DRAPES) ×4 IMPLANT
DRSG GLASSCOCK MASTOID ADT (GAUZE/BANDAGES/DRESSINGS) IMPLANT
DRSG GLASSCOCK MASTOID PED (GAUZE/BANDAGES/DRESSINGS) IMPLANT
ELECT CAUTERY NEEDLE 2.0 MIC (NEEDLE) IMPLANT
ELECT REM PT RETURN 9FT ADLT (ELECTROSURGICAL) ×2
ELECTRODE REM PT RTRN 9FT ADLT (ELECTROSURGICAL) ×1 IMPLANT
GLOVE BIO SURGEON STRL SZ7.5 (GLOVE) ×4 IMPLANT
GLOVE SURG TRIUMPH 8.0 PF LTX (GLOVE) ×2 IMPLANT
KIT ROOM TURNOVER OR (KITS) ×2 IMPLANT
NEEDLE HYPO 25GX1X1/2 BEV (NEEDLE) ×2 IMPLANT
NS IRRIG 500ML POUR BTL (IV SOLUTION) ×2 IMPLANT
PACK DRAPE NASAL/ENT (PACKS) ×2 IMPLANT
PENCIL ELECTRO HAND CTR (MISCELLANEOUS) ×2 IMPLANT
SLEEVE PROTECTION STRL DISP (MISCELLANEOUS) ×4 IMPLANT
SOL PREP PVP 2OZ (MISCELLANEOUS) ×2
SOLUTION PREP PVP 2OZ (MISCELLANEOUS) ×1 IMPLANT
STAPLER SKIN PROX 35W (STAPLE) ×2 IMPLANT
STRAP BODY AND KNEE 60X3 (MISCELLANEOUS) ×2 IMPLANT
SUT PLAIN GUT (SUTURE) ×2 IMPLANT
SUT PLAIN GUT FAST 5-0 (SUTURE) IMPLANT
SUT VIC AB 4-0 RB1 27 (SUTURE)
SUT VIC AB 4-0 RB1 27X BRD (SUTURE) IMPLANT
SYR 3ML LL SCALE MARK (SYRINGE) ×2 IMPLANT
TOWEL OR 17X26 4PK STRL BLUE (TOWEL DISPOSABLE) ×2 IMPLANT

## 2016-02-29 NOTE — Transfer of Care (Signed)
Immediate Anesthesia Transfer of Care Note  Patient: Jasmine Cortez  Procedure(s) Performed: Procedure(s) with comments: TYMPANOPLASTY WITH CONCHAL CARTILAGE GRAFT (Right) - UPREG  Patient Location: PACU  Anesthesia Type: General  Level of Consciousness: awake, alert  and patient cooperative  Airway and Oxygen Therapy: Patient Spontanous Breathing and Patient connected to supplemental oxygen  Post-op Assessment: Post-op Vital signs reviewed, Patient's Cardiovascular Status Stable, Respiratory Function Stable, Patent Airway and No signs of Nausea or vomiting  Post-op Vital Signs: Reviewed and stable  Complications: No apparent anesthesia complications

## 2016-02-29 NOTE — Anesthesia Procedure Notes (Signed)
Procedure Name: LMA Insertion Date/Time: 02/29/2016 12:50 PM Performed by: Jimmy PicketAMYOT, Verlie Liotta Pre-anesthesia Checklist: Patient identified, Emergency Drugs available, Suction available, Timeout performed and Patient being monitored Patient Re-evaluated:Patient Re-evaluated prior to inductionOxygen Delivery Method: Circle system utilized Preoxygenation: Pre-oxygenation with 100% oxygen Intubation Type: IV induction LMA: LMA inserted LMA Size: 4.0 Number of attempts: 1 Placement Confirmation: positive ETCO2 and breath sounds checked- equal and bilateral Tube secured with: Tape

## 2016-02-29 NOTE — Anesthesia Postprocedure Evaluation (Signed)
Anesthesia Post Note  Patient: Jasmine Cortez  Procedure(s) Performed: Procedure(s) (LRB): TYMPANOPLASTY WITH CONCHAL CARTILAGE GRAFT (Right)  Patient location during evaluation: PACU Anesthesia Type: General Level of consciousness: awake and alert Pain management: pain level controlled Vital Signs Assessment: post-procedure vital signs reviewed and stable Respiratory status: spontaneous breathing, nonlabored ventilation and respiratory function stable Cardiovascular status: blood pressure returned to baseline and stable Postop Assessment: no signs of nausea or vomiting Anesthetic complications: no    DANIEL D KOVACS

## 2016-02-29 NOTE — Anesthesia Preprocedure Evaluation (Addendum)
Anesthesia Evaluation  Patient identified by MRN, date of birth, ID band Patient awake    Reviewed: Allergy & Precautions, H&P , NPO status , Patient's Chart, lab work & pertinent test results, reviewed documented beta blocker date and time   Airway Mallampati: II  TM Distance: >3 FB Neck ROM: full    Dental no notable dental hx.    Pulmonary Current Smoker,    Pulmonary exam normal breath sounds clear to auscultation       Cardiovascular Exercise Tolerance: Good negative cardio ROS   Rhythm:regular Rate:Normal     Neuro/Psych negative neurological ROS  negative psych ROS   GI/Hepatic negative GI ROS, Neg liver ROS,   Endo/Other  negative endocrine ROS  Renal/GU negative Renal ROS  negative genitourinary   Musculoskeletal   Abdominal   Peds  Hematology negative hematology ROS (+)   Anesthesia Other Findings   Reproductive/Obstetrics negative OB ROS                             Anesthesia Physical Anesthesia Plan  ASA: I  Anesthesia Plan: General   Post-op Pain Management:    Induction:   Airway Management Planned:   Additional Equipment:   Intra-op Plan:   Post-operative Plan:   Informed Consent: I have reviewed the patients History and Physical, chart, labs and discussed the procedure including the risks, benefits and alternatives for the proposed anesthesia with the patient or authorized representative who has indicated his/her understanding and acceptance.   Dental Advisory Given  Plan Discussed with: CRNA  Anesthesia Plan Comments:        Anesthesia Quick Evaluation

## 2016-02-29 NOTE — Op Note (Signed)
02/29/2016  1:21 PM    Benedetto GoadMcArthur, Ursula AlertWendi  [id number]   Pre-Op Dx: RIGHT TYMPANIC MEMBRANE PERFORATION  Post-op Dx: SAME  Proc: Right tympanoplasty with lysis of adhesions, harvest tragal perichondrial graft   Surg:  Linus SalmonsMCQUEEN,Khalif Stender T  Anes:  GOT  EBL:  Less than 5 cc  Comp:  None  Findings:  20% perforation the pars tensa centrally  Procedure: Worthy RancherWinnie was identified in the holding area taken the operating room placed in supine position. After laryngeal mask anesthesia was administered the table was turned 90 and the right ear was prepped and draped sterilely. A local anesthetic of 1% lidocaine with 1 100,000 insulin epinephrine was used to inject the tragus and the chonchal bowl. A total of 3 cc was used. The operating my prescription is and brought into the field. The right ear was examined there was 20% perforation the pars tensa centrally. A straight needle was then used to rim the per to remove the epithelial tract several small adhesions were then lysed to the middle ear. A cotton ball the genitals and placed against the tympanic membrane the operation then turned to the harvesting the tragal perichondrial graft. Incision was made on the leading edge of the tragus and a tragal perichondrial graft was harvested in standard fashion. Incision was then closed using interrupted 5-0 chromic. The the cotton ball with adrenaline was then removed the middle ear space was packed with Gelfoam the tragal perichondrial graft was laid in a medial underlay fashion beneath all edges of the perforation. This gave excellent closure of the perforation. The ear canals and filled bacitracin ointment the patient was awakened in the operating room and taken recovery room in stable condition.  Cultures: None  Specimen: None  Dispo:   Good  Plan:  Discharge to home follow-up 1 week  Esgar Barnick T  02/29/2016 1:21 PM

## 2016-02-29 NOTE — H&P (Signed)
  H+P  Reviewed and will be scanned in later. No changes noted. 

## 2016-03-03 ENCOUNTER — Encounter: Payer: Self-pay | Admitting: Unknown Physician Specialty

## 2016-06-27 DIAGNOSIS — R05 Cough: Secondary | ICD-10-CM | POA: Diagnosis not present

## 2016-07-05 ENCOUNTER — Emergency Department (HOSPITAL_BASED_OUTPATIENT_CLINIC_OR_DEPARTMENT_OTHER)
Admission: EM | Admit: 2016-07-05 | Discharge: 2016-07-05 | Disposition: A | Discharge status: Home / Self Care | Payer: 59 | Attending: Emergency Medicine | Admitting: Emergency Medicine

## 2016-07-05 ENCOUNTER — Emergency Department (HOSPITAL_BASED_OUTPATIENT_CLINIC_OR_DEPARTMENT_OTHER): Payer: 59

## 2016-07-05 ENCOUNTER — Encounter (HOSPITAL_BASED_OUTPATIENT_CLINIC_OR_DEPARTMENT_OTHER): Payer: Self-pay | Admitting: *Deleted

## 2016-07-05 DIAGNOSIS — Z79899 Other long term (current) drug therapy: Secondary | ICD-10-CM | POA: Insufficient documentation

## 2016-07-05 DIAGNOSIS — J189 Pneumonia, unspecified organism: Secondary | ICD-10-CM | POA: Diagnosis not present

## 2016-07-05 DIAGNOSIS — F17210 Nicotine dependence, cigarettes, uncomplicated: Secondary | ICD-10-CM | POA: Diagnosis not present

## 2016-07-05 DIAGNOSIS — R05 Cough: Secondary | ICD-10-CM | POA: Diagnosis not present

## 2016-07-05 DIAGNOSIS — R0981 Nasal congestion: Secondary | ICD-10-CM | POA: Diagnosis present

## 2016-07-05 MED ORDER — AMOXICILLIN-POT CLAVULANATE 875-125 MG PO TABS: 1.0000 | ORAL_TABLET | Freq: Two times a day (BID) | ORAL | 0 refills | Status: DC

## 2016-07-05 MED ORDER — BENZONATATE 100 MG PO CAPS: 200.0000 mg | ORAL_CAPSULE | Freq: Two times a day (BID) | ORAL | 0 refills | Status: DC | PRN

## 2016-07-05 NOTE — ED Notes (Signed)
Patient returned from XR. 

## 2016-07-05 NOTE — ED Notes (Signed)
SPO2 98-100% while ambulating, completed by Brett CanalesSteve, RT

## 2016-07-05 NOTE — ED Notes (Signed)
Ambulated in hallway on room air, SpO2 98-100%, no DOE noted.  VS per chart

## 2016-07-05 NOTE — ED Triage Notes (Signed)
Patient states she developed generalized body aches, sore throat, chills and fatigue three days ago.  States she had a prescription for [place] flu which she started three days ago.  States she has progressively gotten worse.

## 2016-07-05 NOTE — ED Notes (Signed)
Patient returned from radiology

## 2016-07-05 NOTE — ED Provider Notes (Signed)
[facility] Note   CSN: [id number] Arrival date & time: 07/05/16  1028     History   Chief Complaint Chief Complaint  Patient presents with  . Influenza    HPI Jasmine Cortez is a 41 y.o. female.  Patient presents emergency department with chief complaint of flulike symptoms. She states that her child was diagnosed with influenza last week. She was given Tamiflu for prophylaxis. She states that over the past 3 days she has had worsening symptoms. She reports increased sinus congestion, chest congestion, cough, and some shortness of breath. He reports associated fevers, chills, sore throat. She denies any nausea or vomiting. There are no other associated symptoms. There are no modifying factors.   The history is provided by the patient. No language interpreter was used.    Past Medical History:  Diagnosis Date  . Abnormal Pap smear 06/1999  . Anxiety   . CIN I (cervical intraepithelial neoplasia I) 2005  . GBS carrier   . H/O pyelonephritis    x 1  . H/O varicella   . Hx: UTI (urinary tract infection)   . LGSIL (low grade squamous intraepithelial dysplasia) 10/11/03  . Migraine   . Other cervical disc disorders at C4-C5 level    BONE SPURS  . Pelvic pain 2006  . Tympanic membrane central perforation    right  . Vertigo    hx of    Patient Active Problem List   Diagnosis Date Noted  . Smoker 02/08/2016  . Dysplasia of cervix, low grade (CIN 1) 10/30/2011  . Palpitations 07/18/2011    Past Surgical History:  Procedure Laterality Date  . CARPAL TUNNEL RELEASE     right  . CARPAL TUNNEL RELEASE Right 2010  . COLPOSCOPY  2005  . INNER EAR SURGERY    . INTRAUTERINE DEVICE INSERTION    . TYMPANOPLASTY Right 02/29/2016   Procedure: TYMPANOPLASTY WITH CONCHAL CARTILAGE GRAFT;  Surgeon: Linus Salmons, MD;  [facility];  Service: ENT;  Laterality: Right;  UPREG  . WISDOM TOOTH EXTRACTION  09/1996    OB History    Gravida  Para Term Preterm AB Living   3 3 3     3    SAB TAB Ectopic Multiple Live Births           3       Home Medications    Prior to Admission medications   Medication Sig Start Date End Date Taking? Authorizing Provider  ALPRAZolam Prudy Feeler) 0.5 MG tablet Take 0.5 mg by mouth 2 (two) times daily as needed. 1/2 - 1 tablet twice a day as needed   Yes Historical Provider, MD  oseltamivir (TAMIFLU) 75 MG capsule Take 75 mg by mouth.   Yes Historical Provider, MD  oxyCODONE-acetaminophen (ROXICET) 5-325 MG tablet Take 1-2 tablets by mouth every 4 (four) hours as needed for severe pain. 02/29/16   Linus Salmons, MD    Family History Family History  Problem Relation Age of Onset  . Heart attack Mother 69    Fatal MI  . Hepatitis Mother     c  . Cancer Mother     CERVICAL - TAH  . Heart disease Mother     MI  . Hypertension Father   . Thyroid disease Father   . Hepatitis Father     c  . Alcohol abuse Father   . Heart attack Maternal Grandmother 30  . Hypertension Maternal Grandmother   . Heart disease Maternal  Grandmother     MI  . Cancer Maternal Aunt     CERVICAL - HYST    Social History Social History  Substance Use Topics  . Smoking status: Current Some Day Smoker    Packs/day: 0.25    Years: 5.00    Types: Cigarettes    Last attempt to quit: 07/13/2010  . Smokeless tobacco: Never Used     Comment: Patient smokes 1 pack per month  . Alcohol use 1.2 oz/week    2 Cans of beer per week     Comment: WEEKENDS     Allergies   Patient has no known allergies.   Review of Systems Review of Systems  Constitutional: Positive for chills and fever.  HENT: Positive for postnasal drip, rhinorrhea, sinus pressure, sneezing and sore throat.   Respiratory: Positive for cough. Negative for shortness of breath.   Cardiovascular: Negative for chest pain.  Gastrointestinal: Negative for abdominal pain, constipation, diarrhea, nausea and vomiting.  Genitourinary: Negative for  dysuria.  All other systems reviewed and are negative.    Physical Exam Updated Vital Signs BP 120/70 (BP Location: Right Arm)   Pulse 80   Temp 99 F (37.2 C) (Oral)   Resp 18   Ht 5\' 5"  (1.651 m)   Wt 68 kg   LMP 07/02/2016   SpO2 97%   BMI 24.96 kg/m   Physical Exam Nursing note and vitals reviewed.  Constitutional: Appears well-developed and well-nourished. No distress.  HENT: Oropharynx is mildly erythematous, no tonsillar exudates or abscess, airway intact. Eyes: Conjunctivae are normal. Pupils are equal, round, and reactive to light.  Neck: Normal range of motion and full passive range of motion without pain.  Cardiovascular: Normal rate (80) and intact distal pulses.   Pulmonary/Chest: Effort normal and breath sounds normal. No stridor. No wheezes, rhonchi, or rales. Abdominal: Soft. There is no focal abdominal tenderness.  Musculoskeletal: Normal range of motion. Moves all extremities. Neurological: Pt is alert and oriented to person, place, and time. Sensation and strength grossly intact throughout. Skin: Skin is warm and dry. No rash noted. Pt is not diaphoretic.  Psychiatric: Normal mood and affect.    ED Treatments / Results  Labs (all labs ordered are listed, but only abnormal results are displayed) Labs Reviewed - No data to display  EKG  EKG Interpretation None       Radiology Dg Chest 2 View  Result Date: 07/05/2016 CLINICAL DATA:  Diagnosed with the flu 3 days ago, started on Tamiflu, no improvement, feels worse with generalized aches, fever, chills, cough and sore throat EXAM: CHEST  2 VIEW COMPARISON:  CT chest 02/15/2016 FINDINGS: Normal heart size, mediastinal contours, and pulmonary vascularity. Increased markings at medial RIGHT lung base question lower lobe atelectasis versus infiltrate. Remaining lungs clear. No pleural effusion or pneumothorax. Osseous structures unremarkable. IMPRESSION: Increased markings at medial RIGHT lung base  question atelectasis versus infiltrate. Electronically Signed   By: Ulyses Southward M.D.   On: 07/05/2016 11:46    Procedures Procedures (including critical care time)  Medications Ordered in ED Medications - No data to display   Initial Impression / Assessment and Plan / ED Course  I have reviewed the triage vital signs and the nursing notes.  Pertinent labs & imaging results that were available during my care of the patient were reviewed by me and considered in my medical decision making (see chart for details).     Patient with flu symptoms.  Reports worsening cough and  some sob w/chest congestion.  Will check CXR.  Chest x-ray shows atelectasis versus early infiltrate in the right lung base. Given patient's worsening symptoms, will treat with Augmentin.  Patient ambulates maintaining greater than 95% O2 saturation. Her vital signs are stable. He is nontoxic-appearing. Believe patient to be stable for outpatient follow-up. Clear and specific return precautions given. Patient understands and agrees the plan. She is stable for discharge.  Final Clinical Impressions(s) / ED Diagnoses   Final diagnoses:  Community acquired pneumonia, unspecified laterality    New Prescriptions New Prescriptions   AMOXICILLIN-CLAVULANATE (AUGMENTIN) 875-125 MG TABLET    Take 1 tablet by mouth every 12 (twelve) hours.   BENZONATATE (TESSALON) 100 MG CAPSULE    Take 2 capsules (200 mg total) by mouth 2 (two) times daily as needed for cough.     Roxy Horsemanobert Tessa Seaberry, PA-C 07/05/16 1229    Vanetta MuldersScott Zackowski, MD 07/06/16 [phone]

## 2016-07-05 NOTE — ED Notes (Addendum)
Patient with dx of flu 3 days ago, started on tamiflu at that time. patient reports she is not getting better, but actually feels worse. Patient with generalized aches, fever/chills, cough, and sore throat. Patient is A&Ox4, NAD. Visitor at bedside. Treating symptoms with ibuprofen, states she "isn't able to eat or drink" due to throat discomfort.

## 2017-01-02 DIAGNOSIS — Z1231 Encounter for screening mammogram for malignant neoplasm of breast: Secondary | ICD-10-CM | POA: Diagnosis not present

## 2017-01-02 DIAGNOSIS — Z124 Encounter for screening for malignant neoplasm of cervix: Secondary | ICD-10-CM | POA: Diagnosis not present

## 2017-01-02 DIAGNOSIS — Z01419 Encounter for gynecological examination (general) (routine) without abnormal findings: Secondary | ICD-10-CM | POA: Diagnosis not present

## 2017-04-09 IMAGING — CT CT HEART SCORING
2 series · 16 of 20 positions shown, 18 images · non-contrast
Comparison: None.

CLINICAL DATA: Risk stratification

EXAM:
Coronary Calcium Score
TECHNIQUE: The patient was scanned on a Siemens Somatom 64 slice scanner. Axial
non-contrast 3 mm slices were carried out through the heart. The
data set was analyzed on a dedicated work station and scored using
the Agatson method.

[Series 2: casc 3.0 i36f 2 bestdiast 73 % · axial · 0.37mm/px · z∈[-225,-120]mm · 8 of 45 slices shown, 10 images]
[im 5/45  vessel]
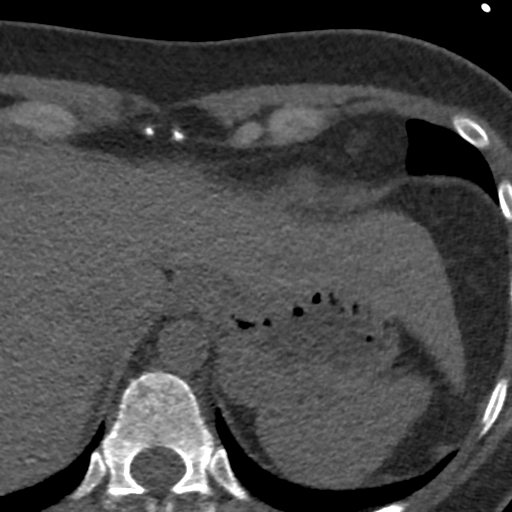
[im 5/45  lung]
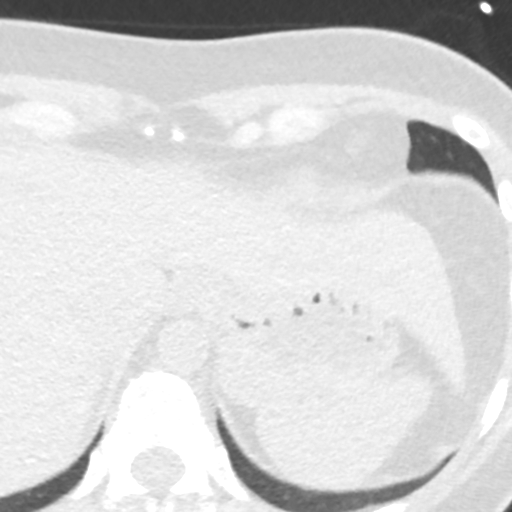
[im 10/45  vessel]
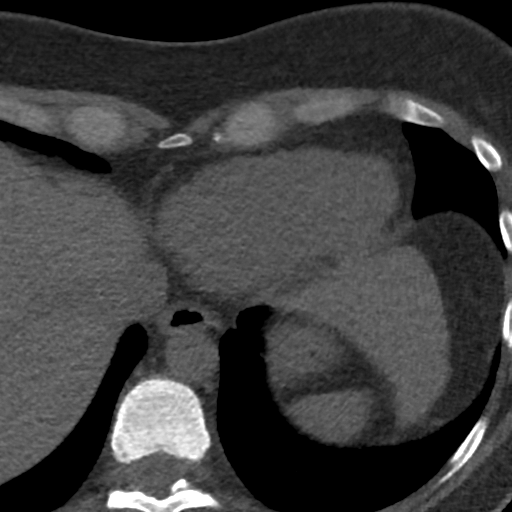
[im 15/45  vessel]
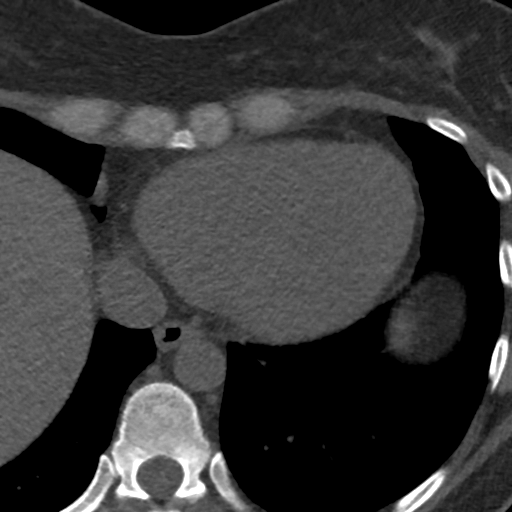
[im 20/45  vessel]
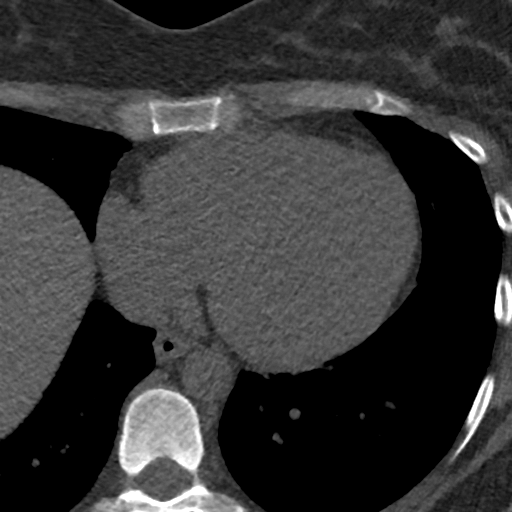
[im 25/45  vessel]
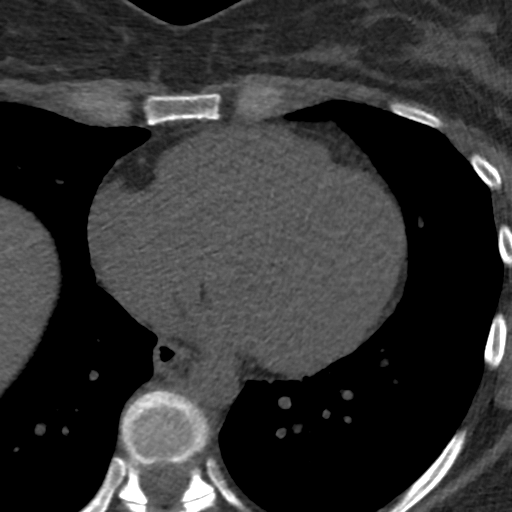
[im 25/45  lung]
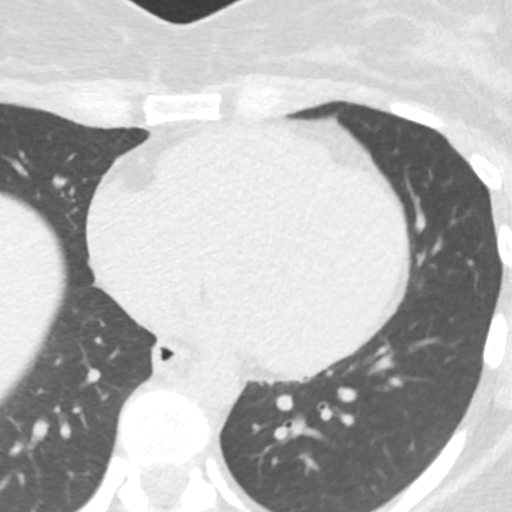
[im 30/45  vessel]
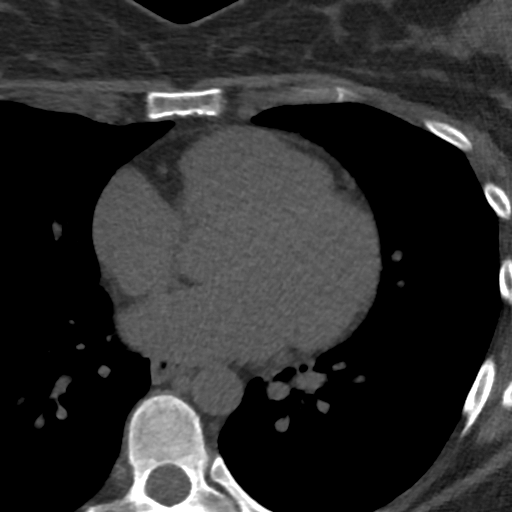
[im 35/45  vessel]
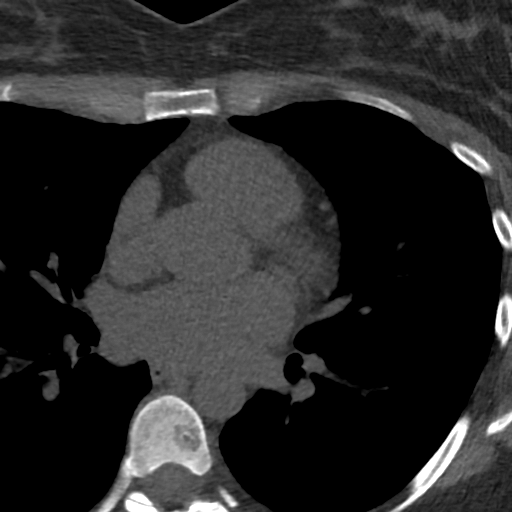
[im 40/45  vessel]
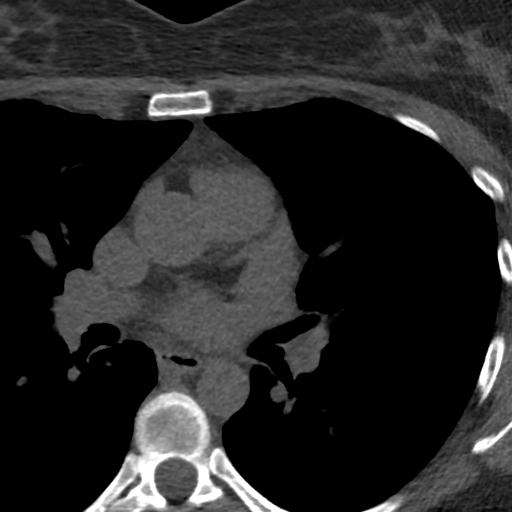

[Series 4: lung st 73 % · axial · 0.68mm/px · z∈[-224,-120]mm · 8 of 45 slices shown]
[im 5/45  lung]
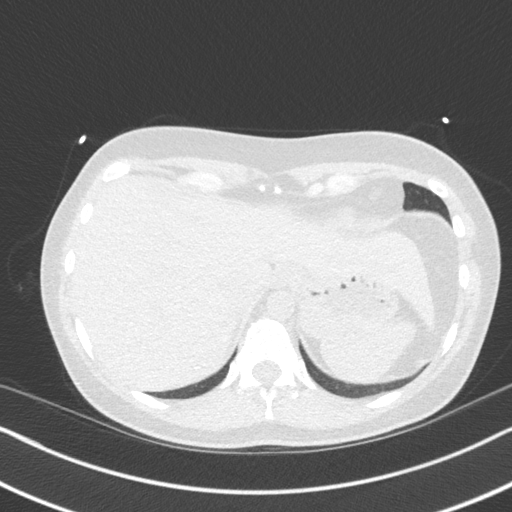
[im 10/45  lung]
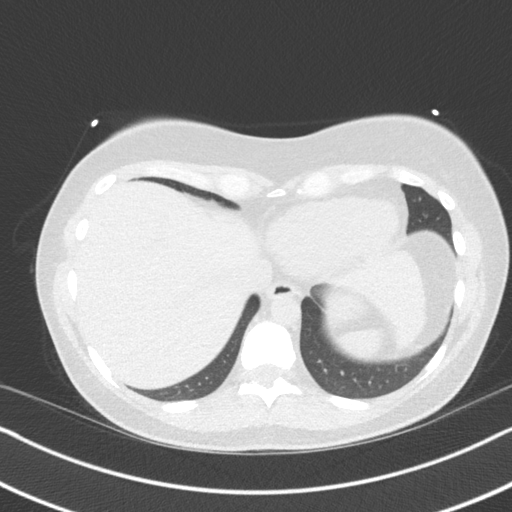
[im 15/45  lung]
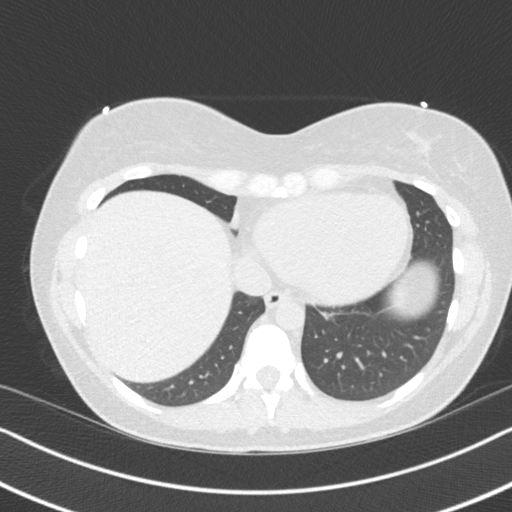
[im 20/45  lung]
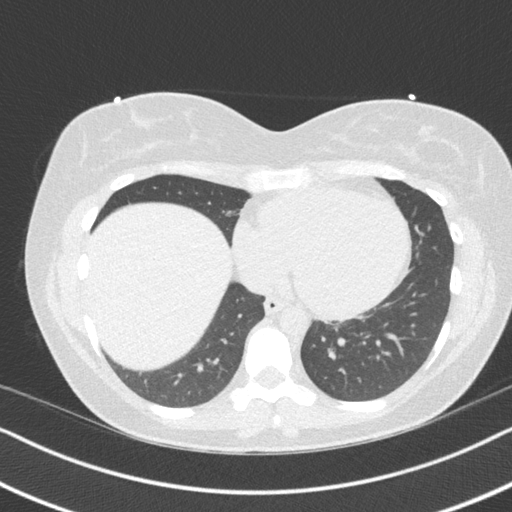
[im 25/45  lung]
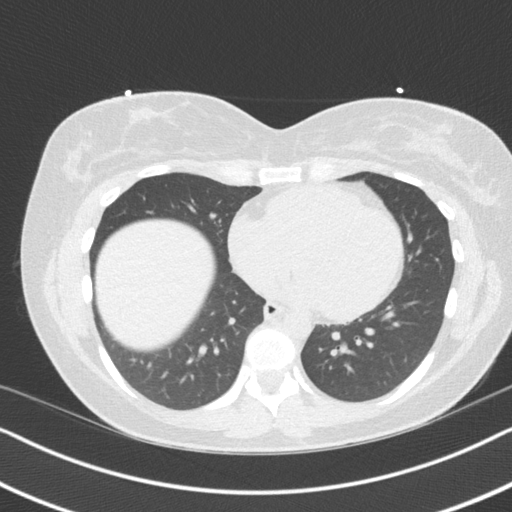
[im 30/45  lung]
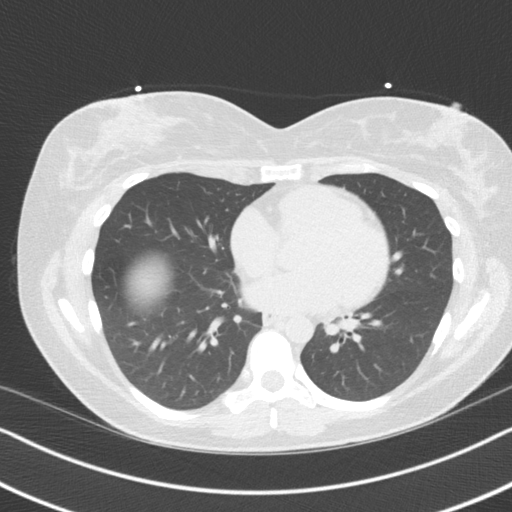
[im 35/45  lung]
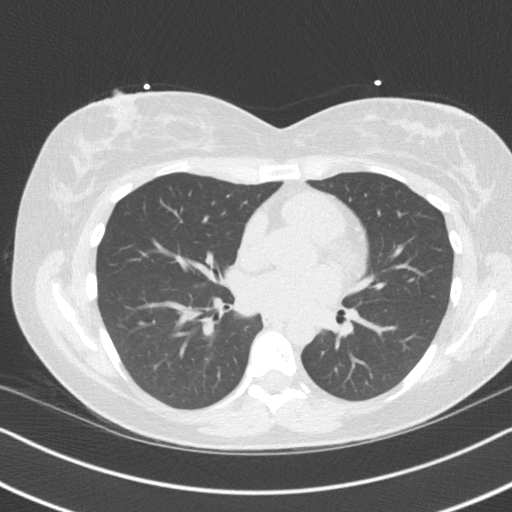
[im 40/45  lung]
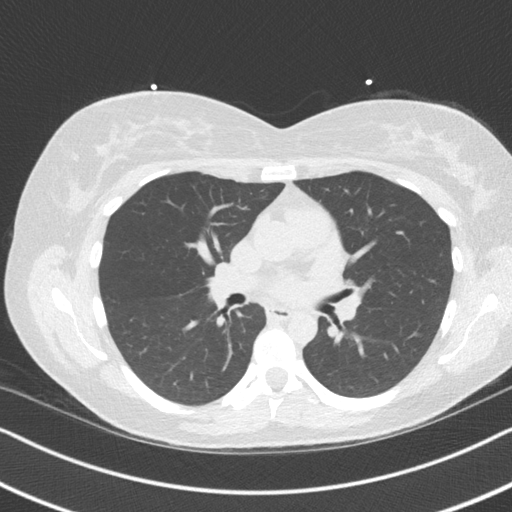

[16 of 20 positions shown; findings below may reference images not displayed]

FINDINGS: Non-cardiac: See separate report from [REDACTED].

Ascending Aorta:  Normal size.  No calcifications.

Pericardium: Normal.

Coronary arteries:  Normal origin.
IMPRESSION: Coronary calcium score of 0. This was 0 percentile for age and sex
matched control.

Polin Billiot

EXAM:
OVER-READ INTERPRETATION  CT CHEST

The following report is an over-read performed by radiologist Dr.
does not include interpretation of cardiac or coronary anatomy or
pathology. The coronary calcium score interpretation by the
cardiologist is attached.
FINDINGS: Mediastinum/Nodes: Normal aortic caliber. No imaged thoracic
adenopathy, given limitations of noncontrast exam.

Lungs/Pleura: No pleural fluid.  Clear imaged lungs.

Upper Abdomen: Normal imaged portions of the liver, spleen, stomach.
Right hemidiaphragm elevation is mild to moderate.

Musculoskeletal: No acute osseous abnormality.
IMPRESSION: No significant extracardiac findings within the imaged chest.

## 2017-05-27 DIAGNOSIS — H0012 Chalazion right lower eyelid: Secondary | ICD-10-CM | POA: Diagnosis not present

## 2017-07-31 DIAGNOSIS — Z23 Encounter for immunization: Secondary | ICD-10-CM | POA: Diagnosis not present

## 2018-02-10 DIAGNOSIS — Z3040 Encounter for surveillance of contraceptives, unspecified: Secondary | ICD-10-CM | POA: Diagnosis not present

## 2018-02-10 DIAGNOSIS — Z1231 Encounter for screening mammogram for malignant neoplasm of breast: Secondary | ICD-10-CM | POA: Diagnosis not present

## 2018-02-10 DIAGNOSIS — Z6825 Body mass index (BMI) 25.0-25.9, adult: Secondary | ICD-10-CM | POA: Diagnosis not present

## 2018-02-10 DIAGNOSIS — Z01419 Encounter for gynecological examination (general) (routine) without abnormal findings: Secondary | ICD-10-CM | POA: Diagnosis not present

## 2018-04-01 NOTE — Progress Notes (Signed)
Cardiology Office Note  Date:  04/02/2018   ID:  [name], DOB 18-Jan-1976, MRN [id number]  PCP:  Deatra James, MD   Chief Complaint  Patient presents with  . Other    Patient requesting follow up. Last seen 01/2016. Patient denies chest pain and SOB. Meds reviewed verbally with patient.     HPI:  [name] is a 42 year old woman with history of  social smoking,  strong family history of coronary artery disease,  anxiety  CT chest 2017: Coronary calcium score of 0. This was 0 percentile for age and sex matched control. 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  PSH:    Past Surgical History:  Procedure Laterality Date  . CARPAL TUNNEL RELEASE     right  . CARPAL TUNNEL RELEASE Right 2010  . COLPOSCOPY  2005  . INNER EAR SURGERY    . INTRAUTERINE DEVICE INSERTION    . TYMPANOPLASTY Right 02/29/2016   Procedure: TYMPANOPLASTY WITH CONCHAL CARTILAGE GRAFT;  Surgeon: Linus Salmons, MD;   Location: [facility];  Service: ENT;  Laterality: Right;  UPREG  . WISDOM TOOTH EXTRACTION  09/1996    Current Outpatient Medications  Medication Sig Dispense Refill  . ALPRAZolam (XANAX) 0.5 MG tablet Take 0.5 mg by mouth 2 (two) times daily as needed. 1/2 - 1 tablet twice a day as needed     No current facility-administered medications for this visit.      Allergies:   Patient has no known allergies.   Social History:  The patient  reports that she has been smoking cigarettes. She has a 1.25 pack-year smoking history. She has never used smokeless tobacco. She reports that she drinks about 2.0 standard drinks of alcohol per week. She reports that she does not use drugs.   Family History:   family history includes Alcohol abuse in her father; Cancer in her maternal aunt and mother; Heart attack (age of onset: 52) in her maternal grandmother; Heart attack (age of onset: 33) in her mother; Heart disease in her maternal grandmother and mother; Hepatitis in her father and mother; Hypertension in her father and maternal grandmother; Thyroid disease in her father.    Review of Systems: Review of Systems  Constitutional: Negative.   Respiratory: Negative.   Cardiovascular: Positive for palpitations.  Gastrointestinal: Negative.   Musculoskeletal: Negative.   Neurological: Negative.   Psychiatric/Behavioral: Negative.   All other systems reviewed and are negative.   PHYSICAL EXAM: VS:  BP 110/66 (BP Location: Left Arm, Patient Position: Sitting, Cuff Size: Normal)   Pulse 68  Ht 5\' 5"  (1.651 m)   Wt 157 lb (71.2 kg)   BMI 26.13 kg/m  , BMI Body mass index is 26.13 kg/m. Constitutional:  oriented to person, place, and time. No distress.  HENT:  Head: Grossly normal Eyes:  no discharge. No scleral icterus.  Neck: No JVD, no carotid bruits  Cardiovascular: Regular rate and rhythm, no murmurs appreciated Pulmonary/Chest: Clear to auscultation bilaterally, no wheezes or  rails Abdominal: Soft.  no distension.  no tenderness.  Musculoskeletal: Normal range of motion Neurological:  normal muscle tone. Coordination normal. No atrophy Skin: Skin warm and dry Psychiatric: normal affect, pleasant  Recent Labs: No results found for requested labs within last 8760 hours.    Lipid Panel No results found for: CHOL, HDL, LDLCALC, TRIG    Wt Readings from Last 3 Encounters:  04/02/18 157 lb (71.2 kg)  07/05/16 150 lb (68 kg)  02/29/16 164 lb (74.4 kg)       ASSESSMENT AND PLAN:  Family history of premature CAD - Plan: CT CARDIAC SCORING Non-smoker at this time, quit Nondiabetic We will try to obtain lipid panel from primary care CT coronary calcium score of 0 No further work-up needed at this time  Palpitations  palpitations likely secondary to PVCs, unable to exclude APCs  Exacerbated in the setting of stress Currently asymptomatic as stress has resolved  Smoker Stopped past year   Total encounter time more than 15 minutes  Greater than 50% was spent in counseling and coordination of care with the patient   Disposition:   F/U  as needed    Orders Placed This Encounter  Procedures  . EKG 12-Lead     Signed, Dossie Arbourim Orie Cuttino, M.D., Ph.D. 04/02/2018  [facility] [place] [phone]

## 2018-04-02 ENCOUNTER — Ambulatory Visit: Payer: 59 | Admitting: Cardiovascular Disease

## 2018-04-02 ENCOUNTER — Encounter: Payer: Self-pay | Admitting: Cardiovascular Disease

## 2018-04-02 VITALS — BP 110/66 | HR 68 | Ht 65.0 in | Wt 157.0 lb

## 2018-04-02 DIAGNOSIS — F17200 Nicotine dependence, unspecified, uncomplicated: Secondary | ICD-10-CM

## 2018-04-02 DIAGNOSIS — R002 Palpitations: Secondary | ICD-10-CM | POA: Diagnosis not present

## 2018-04-02 NOTE — Patient Instructions (Signed)
Medication Instructions:  No changes  If you need a refill on your cardiac medications before your next appointment, please call your pharmacy.    Lab work: No new labs needed   If you have labs (blood work) drawn today and your tests are completely normal, you will receive your results only by: Marland Kitchen. MyChart Message (if you have MyChart) OR . A paper copy in the mail If you have any lab test that is abnormal or we need to change your treatment, we will call you to review the results.   Testing/Procedures: No new testing needed  If you start having bad arrhythmia, Call the office We could order a ZIO monitor (2 week event monitor)   Follow-Up: At [facility], you and your health needs are our priority.  As part of our continuing mission to provide you with exceptional heart care, we have created designated Provider Care Teams.  These Care Teams include your primary Cardiologist (physician) and Advanced Practice Providers (APPs -  Physician Assistants and Nurse Practitioners) who all work together to provide you with the care you need, when you need it.  . You will need a follow up appointment as needed  . Providers on your designated Care Team:   . Nicolasa Duckinghristopher Berge, NP . Eula Listenyan Dunn, PA-C . Marisue IvanJacquelyn Visser, PA-C  Any Other Special Instructions Will Be Listed Below (If Applicable).  For educational health videos Log in to : [email or web address] Or : [email or web address], password : [email or web address]

## 2018-05-06 ENCOUNTER — Telehealth: Payer: Self-pay | Admitting: Cardiovascular Disease

## 2018-05-06 NOTE — Telephone Encounter (Signed)
Spoke with patient and she reports that last night she had a episode where her heart was pounding. She took 1/2 xanax which usually helps but it did not work this time. She then took the other 1/2 and it did finally subside. She reports that she did have a Dr. Reino KentPepper a few hours before that started and has been taking decongestant but took that first thing in the morning. Reviewed that caffeine, stress, anxiety, chocolate, energy drinks, and medications with decongestant can certainly cause increased palpitations or fast heart rates. Based on her last office visit provider did offer for her to wear a zio monitor if her palpitations persisted or worsened. Reviewed the Zio monitor with patient and offered to schedule her a time to come in to have one put on. She declined monitor at this time but was agreeable to call back if these symptoms persisted and we could schedule monitor placement. She was appreciative for the call back with no further questions at this time.

## 2018-05-06 NOTE — Telephone Encounter (Signed)
Patient c/o Palpitations:  High priority if patient c/o lightheadedness, shortness of breath, or chest pain  1) How long have you had palpitations/irregular HR/ Afib? Are you having the symptoms now? A while.  Not right now  2) Are you currently experiencing lightheadedness, SOB or CP? No   3) Do you have a history of afib (atrial fibrillation) or irregular heart rhythm? no  4) Have you checked your BP or HR? (document readings if available): HR up to 188   5) Are you experiencing any other symptoms? Heart Punched feeling during these intermittent episodes not relieved with xanax as usually they are

## 2019-05-11 ENCOUNTER — Ambulatory Visit: Payer: 59 | Attending: Internal Medicine

## 2019-05-11 DIAGNOSIS — Z20822 Contact with and (suspected) exposure to covid-19: Secondary | ICD-10-CM

## 2019-05-12 LAB — NOVEL CORONAVIRUS, NAA: SARS-CoV-2, NAA: NOT DETECTED

## 2019-05-19 ENCOUNTER — Ambulatory Visit: Payer: 59 | Attending: Internal Medicine

## 2019-05-19 DIAGNOSIS — Z20822 Contact with and (suspected) exposure to covid-19: Secondary | ICD-10-CM

## 2019-05-23 ENCOUNTER — Other Ambulatory Visit: Payer: 59

## 2019-05-24 ENCOUNTER — Ambulatory Visit: Payer: 59 | Attending: Internal Medicine

## 2019-05-24 ENCOUNTER — Other Ambulatory Visit: Payer: Self-pay

## 2019-05-24 DIAGNOSIS — Z20822 Contact with and (suspected) exposure to covid-19: Secondary | ICD-10-CM

## 2019-05-25 LAB — NOVEL CORONAVIRUS, NAA

## 2019-05-26 LAB — NOVEL CORONAVIRUS, NAA: SARS-CoV-2, NAA: DETECTED — AB

## 2019-08-03 DIAGNOSIS — F411 Generalized anxiety disorder: Secondary | ICD-10-CM | POA: Diagnosis not present

## 2019-08-03 DIAGNOSIS — H00013 Hordeolum externum right eye, unspecified eyelid: Secondary | ICD-10-CM | POA: Diagnosis not present

## 2021-02-01 DIAGNOSIS — F411 Generalized anxiety disorder: Secondary | ICD-10-CM | POA: Diagnosis not present

## 2021-02-01 DIAGNOSIS — Z23 Encounter for immunization: Secondary | ICD-10-CM | POA: Diagnosis not present

## 2021-04-17 DIAGNOSIS — Z79890 Hormone replacement therapy (postmenopausal): Secondary | ICD-10-CM | POA: Diagnosis not present

## 2021-04-17 DIAGNOSIS — Z3040 Encounter for surveillance of contraceptives, unspecified: Secondary | ICD-10-CM | POA: Diagnosis not present

## 2021-04-17 DIAGNOSIS — Z01419 Encounter for gynecological examination (general) (routine) without abnormal findings: Secondary | ICD-10-CM | POA: Diagnosis not present

## 2021-04-17 DIAGNOSIS — Z1231 Encounter for screening mammogram for malignant neoplasm of breast: Secondary | ICD-10-CM | POA: Diagnosis not present

## 2021-05-03 DIAGNOSIS — F411 Generalized anxiety disorder: Secondary | ICD-10-CM | POA: Diagnosis not present

## 2021-05-03 DIAGNOSIS — F331 Major depressive disorder, recurrent, moderate: Secondary | ICD-10-CM | POA: Diagnosis not present

## 2021-10-18 DIAGNOSIS — D485 Neoplasm of uncertain behavior of skin: Secondary | ICD-10-CM | POA: Diagnosis not present

## 2021-11-08 DIAGNOSIS — F331 Major depressive disorder, recurrent, moderate: Secondary | ICD-10-CM | POA: Diagnosis not present

## 2021-11-08 DIAGNOSIS — F411 Generalized anxiety disorder: Secondary | ICD-10-CM | POA: Diagnosis not present

## 2021-11-08 DIAGNOSIS — E6609 Other obesity due to excess calories: Secondary | ICD-10-CM | POA: Diagnosis not present

## 2021-12-11 DIAGNOSIS — F4310 Post-traumatic stress disorder, unspecified: Secondary | ICD-10-CM | POA: Diagnosis not present

## 2021-12-11 DIAGNOSIS — R0602 Shortness of breath: Secondary | ICD-10-CM | POA: Diagnosis not present

## 2021-12-11 DIAGNOSIS — R635 Abnormal weight gain: Secondary | ICD-10-CM | POA: Diagnosis not present

## 2021-12-11 DIAGNOSIS — Z131 Encounter for screening for diabetes mellitus: Secondary | ICD-10-CM | POA: Diagnosis not present

## 2021-12-11 DIAGNOSIS — Z13220 Encounter for screening for lipoid disorders: Secondary | ICD-10-CM | POA: Diagnosis not present

## 2021-12-11 DIAGNOSIS — E669 Obesity, unspecified: Secondary | ICD-10-CM | POA: Diagnosis not present

## 2021-12-11 DIAGNOSIS — G43909 Migraine, unspecified, not intractable, without status migrainosus: Secondary | ICD-10-CM | POA: Diagnosis not present

## 2021-12-11 DIAGNOSIS — N926 Irregular menstruation, unspecified: Secondary | ICD-10-CM | POA: Diagnosis not present

## 2021-12-11 DIAGNOSIS — R5383 Other fatigue: Secondary | ICD-10-CM | POA: Diagnosis not present

## 2022-01-01 DIAGNOSIS — F4312 Post-traumatic stress disorder, chronic: Secondary | ICD-10-CM | POA: Diagnosis not present

## 2022-01-01 DIAGNOSIS — D519 Vitamin B12 deficiency anemia, unspecified: Secondary | ICD-10-CM | POA: Diagnosis not present

## 2022-01-01 DIAGNOSIS — E7800 Pure hypercholesterolemia, unspecified: Secondary | ICD-10-CM | POA: Diagnosis not present

## 2022-01-01 DIAGNOSIS — G2581 Restless legs syndrome: Secondary | ICD-10-CM | POA: Diagnosis not present

## 2022-01-28 DIAGNOSIS — G2581 Restless legs syndrome: Secondary | ICD-10-CM | POA: Diagnosis not present

## 2022-01-28 DIAGNOSIS — D519 Vitamin B12 deficiency anemia, unspecified: Secondary | ICD-10-CM | POA: Diagnosis not present

## 2022-01-28 DIAGNOSIS — G43909 Migraine, unspecified, not intractable, without status migrainosus: Secondary | ICD-10-CM | POA: Diagnosis not present

## 2022-01-28 DIAGNOSIS — Z9189 Other specified personal risk factors, not elsewhere classified: Secondary | ICD-10-CM | POA: Diagnosis not present

## 2022-01-28 DIAGNOSIS — F17290 Nicotine dependence, other tobacco product, uncomplicated: Secondary | ICD-10-CM | POA: Diagnosis not present

## 2022-02-18 DIAGNOSIS — D519 Vitamin B12 deficiency anemia, unspecified: Secondary | ICD-10-CM | POA: Diagnosis not present

## 2022-02-18 DIAGNOSIS — R197 Diarrhea, unspecified: Secondary | ICD-10-CM | POA: Diagnosis not present

## 2022-02-18 DIAGNOSIS — F322 Major depressive disorder, single episode, severe without psychotic features: Secondary | ICD-10-CM | POA: Diagnosis not present

## 2022-02-18 DIAGNOSIS — G43909 Migraine, unspecified, not intractable, without status migrainosus: Secondary | ICD-10-CM | POA: Diagnosis not present

## 2022-03-18 DIAGNOSIS — D519 Vitamin B12 deficiency anemia, unspecified: Secondary | ICD-10-CM | POA: Diagnosis not present

## 2022-03-18 DIAGNOSIS — R197 Diarrhea, unspecified: Secondary | ICD-10-CM | POA: Diagnosis not present

## 2022-03-18 DIAGNOSIS — G43909 Migraine, unspecified, not intractable, without status migrainosus: Secondary | ICD-10-CM | POA: Diagnosis not present

## 2022-03-18 DIAGNOSIS — F322 Major depressive disorder, single episode, severe without psychotic features: Secondary | ICD-10-CM | POA: Diagnosis not present

## 2022-04-15 DIAGNOSIS — R197 Diarrhea, unspecified: Secondary | ICD-10-CM | POA: Diagnosis not present

## 2022-04-15 DIAGNOSIS — G43909 Migraine, unspecified, not intractable, without status migrainosus: Secondary | ICD-10-CM | POA: Diagnosis not present

## 2022-04-15 DIAGNOSIS — D519 Vitamin B12 deficiency anemia, unspecified: Secondary | ICD-10-CM | POA: Diagnosis not present

## 2022-04-15 DIAGNOSIS — M722 Plantar fascial fibromatosis: Secondary | ICD-10-CM | POA: Diagnosis not present

## 2022-04-21 DIAGNOSIS — Z1231 Encounter for screening mammogram for malignant neoplasm of breast: Secondary | ICD-10-CM | POA: Diagnosis not present

## 2022-04-21 DIAGNOSIS — Z01419 Encounter for gynecological examination (general) (routine) without abnormal findings: Secondary | ICD-10-CM | POA: Diagnosis not present

## 2022-04-21 DIAGNOSIS — Z1211 Encounter for screening for malignant neoplasm of colon: Secondary | ICD-10-CM | POA: Diagnosis not present

## 2022-04-21 DIAGNOSIS — Z124 Encounter for screening for malignant neoplasm of cervix: Secondary | ICD-10-CM | POA: Diagnosis not present

## 2022-05-15 DIAGNOSIS — F419 Anxiety disorder, unspecified: Secondary | ICD-10-CM | POA: Diagnosis not present

## 2022-05-15 DIAGNOSIS — R635 Abnormal weight gain: Secondary | ICD-10-CM | POA: Diagnosis not present

## 2022-05-15 DIAGNOSIS — Z1211 Encounter for screening for malignant neoplasm of colon: Secondary | ICD-10-CM | POA: Diagnosis not present

## 2022-05-26 DIAGNOSIS — E663 Overweight: Secondary | ICD-10-CM | POA: Diagnosis not present

## 2022-05-26 DIAGNOSIS — G43909 Migraine, unspecified, not intractable, without status migrainosus: Secondary | ICD-10-CM | POA: Diagnosis not present

## 2022-05-26 DIAGNOSIS — M722 Plantar fascial fibromatosis: Secondary | ICD-10-CM | POA: Diagnosis not present

## 2022-05-26 DIAGNOSIS — Z6829 Body mass index (BMI) 29.0-29.9, adult: Secondary | ICD-10-CM | POA: Diagnosis not present

## 2022-06-24 DIAGNOSIS — E663 Overweight: Secondary | ICD-10-CM | POA: Diagnosis not present

## 2022-06-24 DIAGNOSIS — G43909 Migraine, unspecified, not intractable, without status migrainosus: Secondary | ICD-10-CM | POA: Diagnosis not present

## 2022-06-24 DIAGNOSIS — Z6828 Body mass index (BMI) 28.0-28.9, adult: Secondary | ICD-10-CM | POA: Diagnosis not present

## 2022-07-14 DIAGNOSIS — G43909 Migraine, unspecified, not intractable, without status migrainosus: Secondary | ICD-10-CM | POA: Diagnosis not present

## 2022-08-12 DIAGNOSIS — G43909 Migraine, unspecified, not intractable, without status migrainosus: Secondary | ICD-10-CM | POA: Diagnosis not present

## 2022-08-12 DIAGNOSIS — E663 Overweight: Secondary | ICD-10-CM | POA: Diagnosis not present

## 2022-08-12 DIAGNOSIS — Z9189 Other specified personal risk factors, not elsewhere classified: Secondary | ICD-10-CM | POA: Diagnosis not present

## 2022-08-22 DIAGNOSIS — D123 Benign neoplasm of transverse colon: Secondary | ICD-10-CM | POA: Diagnosis not present

## 2022-08-22 DIAGNOSIS — K635 Polyp of colon: Secondary | ICD-10-CM | POA: Diagnosis not present

## 2022-08-22 DIAGNOSIS — Z1211 Encounter for screening for malignant neoplasm of colon: Secondary | ICD-10-CM | POA: Diagnosis not present

## 2022-09-02 DIAGNOSIS — E663 Overweight: Secondary | ICD-10-CM | POA: Diagnosis not present

## 2022-09-02 DIAGNOSIS — G43909 Migraine, unspecified, not intractable, without status migrainosus: Secondary | ICD-10-CM | POA: Diagnosis not present

## 2022-09-30 DIAGNOSIS — E663 Overweight: Secondary | ICD-10-CM | POA: Diagnosis not present

## 2022-09-30 DIAGNOSIS — G43909 Migraine, unspecified, not intractable, without status migrainosus: Secondary | ICD-10-CM | POA: Diagnosis not present

## 2022-10-28 DIAGNOSIS — E663 Overweight: Secondary | ICD-10-CM | POA: Diagnosis not present

## 2022-10-28 DIAGNOSIS — Z9189 Other specified personal risk factors, not elsewhere classified: Secondary | ICD-10-CM | POA: Diagnosis not present

## 2022-10-28 DIAGNOSIS — G43909 Migraine, unspecified, not intractable, without status migrainosus: Secondary | ICD-10-CM | POA: Diagnosis not present

## 2022-11-03 ENCOUNTER — Other Ambulatory Visit: Payer: Self-pay

## 2022-11-03 ENCOUNTER — Emergency Department
Admission: EM | Admit: 2022-11-03 | Discharge: 2022-11-03 | Disposition: A | Discharge status: Home / Self Care | Payer: BC Managed Care – PPO | Attending: Emergency Medicine | Admitting: Emergency Medicine

## 2022-11-03 ENCOUNTER — Other Ambulatory Visit
Admission: RE | Admit: 2022-11-03 | Discharge: 2022-11-03 | Disposition: A | Discharge status: Home / Self Care | Payer: BC Managed Care – PPO | Source: Ambulatory Visit | Attending: Student | Admitting: Student

## 2022-11-03 ENCOUNTER — Emergency Department: Payer: BC Managed Care – PPO

## 2022-11-03 ENCOUNTER — Encounter: Payer: Self-pay | Admitting: Radiology

## 2022-11-03 DIAGNOSIS — M546 Pain in thoracic spine: Secondary | ICD-10-CM | POA: Insufficient documentation

## 2022-11-03 DIAGNOSIS — K769 Liver disease, unspecified: Secondary | ICD-10-CM | POA: Insufficient documentation

## 2022-11-03 DIAGNOSIS — R0781 Pleurodynia: Secondary | ICD-10-CM | POA: Diagnosis not present

## 2022-11-03 DIAGNOSIS — R7989 Other specified abnormal findings of blood chemistry: Secondary | ICD-10-CM | POA: Diagnosis not present

## 2022-11-03 DIAGNOSIS — R9431 Abnormal electrocardiogram [ECG] [EKG]: Secondary | ICD-10-CM | POA: Diagnosis not present

## 2022-11-03 DIAGNOSIS — R079 Chest pain, unspecified: Secondary | ICD-10-CM | POA: Diagnosis not present

## 2022-11-03 LAB — D-DIMER, QUANTITATIVE: D-Dimer, Quant: 0.69 ug/mL-FEU — ABNORMAL HIGH (ref 0.00–0.50)

## 2022-11-03 LAB — BASIC METABOLIC PANEL
Anion gap: 8 (ref 5–15)
BUN: 15 mg/dL (ref 6–20)
CO2: 22 mmol/L (ref 22–32)
Calcium: 9.1 mg/dL (ref 8.9–10.3)
Chloride: 108 mmol/L (ref 98–111)
Creatinine, Ser: 0.96 mg/dL (ref 0.44–1.00)
GFR, Estimated: >60 mL/min (ref >60)
Glucose, Bld: 102 mg/dL — ABNORMAL HIGH (ref 70–99)
Potassium: 3.9 mmol/L (ref 3.5–5.1)
Sodium: 138 mmol/L (ref 135–145)

## 2022-11-03 LAB — POC URINE PREG, ED: Preg Test, Ur: NEGATIVE

## 2022-11-03 MED ORDER — IOHEXOL 350 MG/ML SOLN
75.0000 mL | Freq: Once | INTRAVENOUS | Status: AC | PRN
  Administered 2022-11-03: 75 mL via INTRAVENOUS

## 2022-11-03 NOTE — ED Notes (Signed)
Tubes sent to lab:  Blue Lavender Lite green

## 2022-11-03 NOTE — ED Triage Notes (Signed)
Pt here for back pain. PT states she was at her doctor and they ran blood work and came back her D dimer was elevated. Pt sent for CT scan.

## 2022-11-03 NOTE — ED Notes (Signed)
First nurse note: Pt here from [facility] clinic with c/o upper back pain. [facility] clinic states labs were drawn PTA to ED and "cbc and bmp were normal but d-dimer was slightly elevated as well as some possible EKG changes".

## 2022-11-03 NOTE — ED Provider Notes (Signed)
[facility] Provider Note    Event Date/Time   First MD Initiated Contact with Patient 11/03/22 1732     (approximate)   History   Back Pain   HPI  Jasmine Cortez is a 47 y.o. female who presents because of an elevated D-dimer done outpatient testing.  Patient tells me that she has had some back pain over the weekend it is in the upper back mid thoracic region primarily on the left side.  It was relatively minor but then today when she was getting out of her chair at work it became more severe and took her breath away she felt short of breath momentarily.  She was seen at [facility] clinic and they drew a D-dimer that was positive at 0.69.  She denies any ongoing dyspnea. The patient denies hx of prior DVT/PE, unilateral leg pain/swelling, hormone use, recent surgery, hx of cancer, prolonged immobilization, or hemoptysis.  Denies chest pain.  Pain is not pleuritic.  Denies any numbness tingling in her arms or legs.      Past Medical History:  Diagnosis Date   Abnormal Pap smear 06/1999   Anxiety    CIN I (cervical intraepithelial neoplasia I) 2005   GBS carrier    H/O pyelonephritis    x 1   H/O varicella    Hx: UTI (urinary tract infection)    LGSIL (low grade squamous intraepithelial dysplasia) 10/11/03   Migraine    Other cervical disc disorders at C4-C5 level    BONE SPURS   Pelvic pain 2006   Tympanic membrane central perforation    right   Vertigo    hx of    Patient Active Problem List   Diagnosis Date Noted   Smoker 02/08/2016   Dysplasia of cervix, low grade (CIN 1) 10/30/2011   Palpitations 07/18/2011     Physical Exam  Triage Vital Signs: ED Triage Vitals  Enc Vitals Group     BP 11/03/22 1649 134/85     Pulse Rate 11/03/22 1649 68     Resp 11/03/22 1649 17     Temp 11/03/22 1649 98.3 F (36.8 C)     Temp Source 11/03/22 1649 Oral     SpO2 11/03/22 1649 100 %     Weight 11/03/22 1645 174 lb (78.9 kg)     Height 11/03/22  1645 5\' 6"  (1.676 m)     Head Circumference --      Peak Flow --      Pain Score --      Pain Loc --      Pain Edu? --      Excl. in GC? --     Most recent vital signs: Vitals:   11/03/22 1649  BP: 134/85  Pulse: 68  Resp: 17  Temp: 98.3 F (36.8 C)  SpO2: 100%     General: Awake, no distress.  CV:  Good peripheral perfusion.  Resp:  Normal effort.  Abd:  No distention.  Neuro:             Awake, Alert, Oriented x 3  Other:  Reproducible pain to palpation of the paraspinal musculature in the thoracic region, no rashes, no midline tenderness   ED Results / Procedures / Treatments  Labs (all labs ordered are listed, but only abnormal results are displayed) Labs Reviewed  BASIC METABOLIC PANEL - Abnormal; Notable for the following components:      Result Value   Glucose, Bld 102 (*)  All other components within normal limits  POC URINE PREG, ED     EKG  EKG reviewed and interpreted by myself so significant baseline artifact, sinus rhythm with PVC, low voltage in the limb leads, no acute ischemic changes   RADIOLOGY I reviewed and interpreted patient's CT angio of the chest which is negative for PE   PROCEDURES:  Critical Care performed: No  Procedures    MEDICATIONS ORDERED IN ED: Medications  iohexol (OMNIPAQUE) 350 MG/ML injection 75 mL (75 mLs Intravenous Contrast Given 11/03/22 1801)     IMPRESSION / MDM / ASSESSMENT AND PLAN / ED COURSE  I reviewed the triage vital signs and the nursing notes.                              Patient's presentation is most consistent with acute presentation with potential threat to life or bodily function.  Differential diagnosis includes, but is not limited to, musculoskeletal pain, pleurisy, PE, low suspicion for pathologic fracture  47 year old female presents with upper back pain.  Started over the weekend became more severe when she stood up from her chair today and took her breath away.  Seen at urgent  care and a D-dimer was collected which was mildly positive at 0.69.  Patient has no risk factors for PE.  She is not hypoxic or tachycardic pain is not pleuritic.  No signs of DVT on exam.  She does have reproducible mid paraspinal thoracic tenderness on exam.  My suspicion overall for PE is low but given positive D-dimer we will proceed to CTA.   CT angio is negative for PE.  Radiology does comment on some liver lesions recommends MRI for further characterization.  Discussed this finding with the patient and recommended to have MRI done as an outpatient.  She is otherwise appropriate for discharge given I do suspect pain is musculoskeletal.        FINAL CLINICAL IMPRESSION(S) / ED DIAGNOSES   Final diagnoses:  Acute left-sided thoracic back pain  Liver lesion     Rx / DC Orders   ED Discharge Orders     None        Note:  This document was prepared using Dragon voice recognition software and may include unintentional dictation errors.   Georga Hacking, MD 11/03/22 [phone]

## 2022-11-03 NOTE — Discharge Instructions (Addendum)
We did not find a blood clot on your CAT scan.  You have several liver lesions and you should have an MRI done to further characterize these as an outpatient.  Please discuss this with your primary care doctor.

## 2022-11-11 ENCOUNTER — Other Ambulatory Visit: Payer: Self-pay

## 2022-11-11 DIAGNOSIS — K769 Liver disease, unspecified: Secondary | ICD-10-CM

## 2022-11-11 DIAGNOSIS — F419 Anxiety disorder, unspecified: Secondary | ICD-10-CM | POA: Diagnosis not present

## 2022-11-11 DIAGNOSIS — M546 Pain in thoracic spine: Secondary | ICD-10-CM | POA: Diagnosis not present

## 2022-11-12 ENCOUNTER — Other Ambulatory Visit: Payer: Self-pay | Admitting: Family Medicine

## 2022-11-12 DIAGNOSIS — K769 Liver disease, unspecified: Secondary | ICD-10-CM

## 2022-11-19 ENCOUNTER — Encounter: Payer: Self-pay | Admitting: Family Medicine

## 2022-11-21 ENCOUNTER — Ambulatory Visit
Admission: RE | Admit: 2022-11-21 | Discharge: 2022-11-21 | Disposition: A | Discharge status: Home / Self Care | Payer: BC Managed Care – PPO | Source: Ambulatory Visit | Attending: Family Medicine | Admitting: Family Medicine

## 2022-11-21 DIAGNOSIS — K769 Liver disease, unspecified: Secondary | ICD-10-CM

## 2022-11-21 DIAGNOSIS — D1809 Hemangioma of other sites: Secondary | ICD-10-CM | POA: Diagnosis not present

## 2022-11-21 MED ORDER — GADOPICLENOL 0.5 MMOL/ML IV SOLN
8.0000 mL | Freq: Once | INTRAVENOUS | Status: AC | PRN
  Administered 2022-11-21: 8 mL via INTRAVENOUS

## 2022-11-26 DIAGNOSIS — G43909 Migraine, unspecified, not intractable, without status migrainosus: Secondary | ICD-10-CM | POA: Diagnosis not present

## 2022-11-26 DIAGNOSIS — F419 Anxiety disorder, unspecified: Secondary | ICD-10-CM | POA: Diagnosis not present

## 2022-11-26 DIAGNOSIS — Z9189 Other specified personal risk factors, not elsewhere classified: Secondary | ICD-10-CM | POA: Diagnosis not present

## 2022-12-31 DIAGNOSIS — Z9189 Other specified personal risk factors, not elsewhere classified: Secondary | ICD-10-CM | POA: Diagnosis not present

## 2022-12-31 DIAGNOSIS — G43909 Migraine, unspecified, not intractable, without status migrainosus: Secondary | ICD-10-CM | POA: Diagnosis not present

## 2022-12-31 DIAGNOSIS — F419 Anxiety disorder, unspecified: Secondary | ICD-10-CM | POA: Diagnosis not present

## 2023-01-29 DIAGNOSIS — G43909 Migraine, unspecified, not intractable, without status migrainosus: Secondary | ICD-10-CM | POA: Diagnosis not present

## 2023-01-29 DIAGNOSIS — Z9189 Other specified personal risk factors, not elsewhere classified: Secondary | ICD-10-CM | POA: Diagnosis not present

## 2023-01-29 DIAGNOSIS — F419 Anxiety disorder, unspecified: Secondary | ICD-10-CM | POA: Diagnosis not present

## 2023-02-25 DIAGNOSIS — G43909 Migraine, unspecified, not intractable, without status migrainosus: Secondary | ICD-10-CM | POA: Diagnosis not present

## 2023-02-25 DIAGNOSIS — F419 Anxiety disorder, unspecified: Secondary | ICD-10-CM | POA: Diagnosis not present

## 2023-02-25 DIAGNOSIS — Z9189 Other specified personal risk factors, not elsewhere classified: Secondary | ICD-10-CM | POA: Diagnosis not present

## 2023-03-26 DIAGNOSIS — G43909 Migraine, unspecified, not intractable, without status migrainosus: Secondary | ICD-10-CM | POA: Diagnosis not present

## 2023-03-26 DIAGNOSIS — F419 Anxiety disorder, unspecified: Secondary | ICD-10-CM | POA: Diagnosis not present

## 2023-03-26 DIAGNOSIS — Z9189 Other specified personal risk factors, not elsewhere classified: Secondary | ICD-10-CM | POA: Diagnosis not present

## 2023-04-29 DIAGNOSIS — Z1330 Encounter for screening examination for mental health and behavioral disorders, unspecified: Secondary | ICD-10-CM | POA: Diagnosis not present

## 2023-04-29 DIAGNOSIS — N939 Abnormal uterine and vaginal bleeding, unspecified: Secondary | ICD-10-CM | POA: Diagnosis not present

## 2023-04-29 DIAGNOSIS — Z01419 Encounter for gynecological examination (general) (routine) without abnormal findings: Secondary | ICD-10-CM | POA: Diagnosis not present

## 2023-05-01 DIAGNOSIS — Z1231 Encounter for screening mammogram for malignant neoplasm of breast: Secondary | ICD-10-CM | POA: Diagnosis not present

## 2023-05-07 DIAGNOSIS — E663 Overweight: Secondary | ICD-10-CM | POA: Diagnosis not present

## 2023-05-07 DIAGNOSIS — F419 Anxiety disorder, unspecified: Secondary | ICD-10-CM | POA: Diagnosis not present

## 2023-05-07 DIAGNOSIS — G43909 Migraine, unspecified, not intractable, without status migrainosus: Secondary | ICD-10-CM | POA: Diagnosis not present

## 2023-05-07 DIAGNOSIS — Z9189 Other specified personal risk factors, not elsewhere classified: Secondary | ICD-10-CM | POA: Diagnosis not present

## 2023-05-08 DIAGNOSIS — M654 Radial styloid tenosynovitis [de Quervain]: Secondary | ICD-10-CM | POA: Diagnosis not present

## 2023-06-11 DIAGNOSIS — F419 Anxiety disorder, unspecified: Secondary | ICD-10-CM | POA: Diagnosis not present

## 2023-06-11 DIAGNOSIS — E663 Overweight: Secondary | ICD-10-CM | POA: Diagnosis not present

## 2023-06-11 DIAGNOSIS — Z6827 Body mass index (BMI) 27.0-27.9, adult: Secondary | ICD-10-CM | POA: Diagnosis not present

## 2023-06-11 DIAGNOSIS — G43909 Migraine, unspecified, not intractable, without status migrainosus: Secondary | ICD-10-CM | POA: Diagnosis not present

## 2023-06-11 DIAGNOSIS — Z79899 Other long term (current) drug therapy: Secondary | ICD-10-CM | POA: Diagnosis not present

## 2023-07-03 DIAGNOSIS — M654 Radial styloid tenosynovitis [de Quervain]: Secondary | ICD-10-CM | POA: Diagnosis not present

## 2023-07-15 DIAGNOSIS — E663 Overweight: Secondary | ICD-10-CM | POA: Diagnosis not present

## 2023-07-15 DIAGNOSIS — E538 Deficiency of other specified B group vitamins: Secondary | ICD-10-CM | POA: Diagnosis not present

## 2023-07-15 DIAGNOSIS — Z9189 Other specified personal risk factors, not elsewhere classified: Secondary | ICD-10-CM | POA: Diagnosis not present

## 2023-07-15 DIAGNOSIS — G43909 Migraine, unspecified, not intractable, without status migrainosus: Secondary | ICD-10-CM | POA: Diagnosis not present

## 2023-07-15 DIAGNOSIS — F419 Anxiety disorder, unspecified: Secondary | ICD-10-CM | POA: Diagnosis not present

## 2023-08-11 DIAGNOSIS — F419 Anxiety disorder, unspecified: Secondary | ICD-10-CM | POA: Diagnosis not present

## 2023-08-11 DIAGNOSIS — E663 Overweight: Secondary | ICD-10-CM | POA: Diagnosis not present

## 2023-08-11 DIAGNOSIS — Z9189 Other specified personal risk factors, not elsewhere classified: Secondary | ICD-10-CM | POA: Diagnosis not present

## 2023-08-11 DIAGNOSIS — E538 Deficiency of other specified B group vitamins: Secondary | ICD-10-CM | POA: Diagnosis not present

## 2023-08-11 DIAGNOSIS — G43909 Migraine, unspecified, not intractable, without status migrainosus: Secondary | ICD-10-CM | POA: Diagnosis not present

## 2023-09-15 DIAGNOSIS — F419 Anxiety disorder, unspecified: Secondary | ICD-10-CM | POA: Diagnosis not present

## 2023-09-15 DIAGNOSIS — Z79899 Other long term (current) drug therapy: Secondary | ICD-10-CM | POA: Diagnosis not present

## 2023-09-15 DIAGNOSIS — Z6827 Body mass index (BMI) 27.0-27.9, adult: Secondary | ICD-10-CM | POA: Diagnosis not present

## 2023-09-15 DIAGNOSIS — G43909 Migraine, unspecified, not intractable, without status migrainosus: Secondary | ICD-10-CM | POA: Diagnosis not present

## 2023-09-15 DIAGNOSIS — E538 Deficiency of other specified B group vitamins: Secondary | ICD-10-CM | POA: Diagnosis not present

## 2023-09-15 DIAGNOSIS — E663 Overweight: Secondary | ICD-10-CM | POA: Diagnosis not present

## 2023-10-22 DIAGNOSIS — Z6827 Body mass index (BMI) 27.0-27.9, adult: Secondary | ICD-10-CM | POA: Diagnosis not present

## 2023-10-22 DIAGNOSIS — E663 Overweight: Secondary | ICD-10-CM | POA: Diagnosis not present

## 2023-10-22 DIAGNOSIS — Z79899 Other long term (current) drug therapy: Secondary | ICD-10-CM | POA: Diagnosis not present

## 2023-10-22 DIAGNOSIS — F419 Anxiety disorder, unspecified: Secondary | ICD-10-CM | POA: Diagnosis not present

## 2023-10-22 DIAGNOSIS — E538 Deficiency of other specified B group vitamins: Secondary | ICD-10-CM | POA: Diagnosis not present

## 2023-10-22 DIAGNOSIS — G43909 Migraine, unspecified, not intractable, without status migrainosus: Secondary | ICD-10-CM | POA: Diagnosis not present

## 2023-11-30 DIAGNOSIS — E663 Overweight: Secondary | ICD-10-CM | POA: Diagnosis not present

## 2023-11-30 DIAGNOSIS — E538 Deficiency of other specified B group vitamins: Secondary | ICD-10-CM | POA: Diagnosis not present

## 2023-11-30 DIAGNOSIS — F419 Anxiety disorder, unspecified: Secondary | ICD-10-CM | POA: Diagnosis not present

## 2023-11-30 DIAGNOSIS — G43909 Migraine, unspecified, not intractable, without status migrainosus: Secondary | ICD-10-CM | POA: Diagnosis not present

## 2023-12-31 DIAGNOSIS — E538 Deficiency of other specified B group vitamins: Secondary | ICD-10-CM | POA: Diagnosis not present

## 2023-12-31 DIAGNOSIS — F419 Anxiety disorder, unspecified: Secondary | ICD-10-CM | POA: Diagnosis not present

## 2023-12-31 DIAGNOSIS — E663 Overweight: Secondary | ICD-10-CM | POA: Diagnosis not present

## 2023-12-31 DIAGNOSIS — G43909 Migraine, unspecified, not intractable, without status migrainosus: Secondary | ICD-10-CM | POA: Diagnosis not present

## 2024-02-09 DIAGNOSIS — F419 Anxiety disorder, unspecified: Secondary | ICD-10-CM | POA: Diagnosis not present

## 2024-02-09 DIAGNOSIS — G43909 Migraine, unspecified, not intractable, without status migrainosus: Secondary | ICD-10-CM | POA: Diagnosis not present

## 2024-02-09 DIAGNOSIS — E538 Deficiency of other specified B group vitamins: Secondary | ICD-10-CM | POA: Diagnosis not present

## 2024-02-09 DIAGNOSIS — E663 Overweight: Secondary | ICD-10-CM | POA: Diagnosis not present

## 2024-02-23 ENCOUNTER — Other Ambulatory Visit: Payer: Self-pay | Admitting: Obstetrics and Gynecology

## 2024-02-23 DIAGNOSIS — Z1231 Encounter for screening mammogram for malignant neoplasm of breast: Secondary | ICD-10-CM

## 2024-04-07 DIAGNOSIS — E538 Deficiency of other specified B group vitamins: Secondary | ICD-10-CM | POA: Diagnosis not present

## 2024-04-07 DIAGNOSIS — F419 Anxiety disorder, unspecified: Secondary | ICD-10-CM | POA: Diagnosis not present

## 2024-04-07 DIAGNOSIS — E663 Overweight: Secondary | ICD-10-CM | POA: Diagnosis not present

## 2024-04-07 DIAGNOSIS — G43909 Migraine, unspecified, not intractable, without status migrainosus: Secondary | ICD-10-CM | POA: Diagnosis not present

## 2024-04-08 DIAGNOSIS — F419 Anxiety disorder, unspecified: Secondary | ICD-10-CM | POA: Diagnosis not present

## 2024-05-05 DIAGNOSIS — Z01419 Encounter for gynecological examination (general) (routine) without abnormal findings: Secondary | ICD-10-CM | POA: Diagnosis not present

## 2024-05-05 DIAGNOSIS — Z1330 Encounter for screening examination for mental health and behavioral disorders, unspecified: Secondary | ICD-10-CM | POA: Diagnosis not present

## 2024-05-06 ENCOUNTER — Inpatient Hospital Stay
Admission: RE | Admit: 2024-05-06 | Discharge: 2024-05-06 | Attending: Obstetrics and Gynecology | Admitting: Obstetrics and Gynecology

## 2024-05-06 DIAGNOSIS — Z1231 Encounter for screening mammogram for malignant neoplasm of breast: Secondary | ICD-10-CM

## 2024-05-13 ENCOUNTER — Other Ambulatory Visit: Payer: Self-pay | Admitting: Obstetrics and Gynecology

## 2024-05-13 DIAGNOSIS — R928 Other abnormal and inconclusive findings on diagnostic imaging of breast: Secondary | ICD-10-CM

## 2024-06-01 ENCOUNTER — Encounter

## 2024-06-01 ENCOUNTER — Other Ambulatory Visit

## 2024-06-04 ENCOUNTER — Ambulatory Visit
Admission: RE | Admit: 2024-06-04 | Discharge: 2024-06-04 | Disposition: A | Discharge status: Home / Self Care | Source: Ambulatory Visit | Attending: Obstetrics and Gynecology | Admitting: Obstetrics and Gynecology

## 2024-06-04 ENCOUNTER — Ambulatory Visit

## 2024-06-04 DIAGNOSIS — R928 Other abnormal and inconclusive findings on diagnostic imaging of breast: Secondary | ICD-10-CM

## 2024-06-07 ENCOUNTER — Other Ambulatory Visit: Payer: Self-pay | Admitting: Obstetrics and Gynecology

## 2024-06-07 DIAGNOSIS — Z1231 Encounter for screening mammogram for malignant neoplasm of breast: Secondary | ICD-10-CM

## 2024-06-09 ENCOUNTER — Encounter

## 2024-06-09 ENCOUNTER — Other Ambulatory Visit

## 2025-05-26 ENCOUNTER — Ambulatory Visit
